# Patient Record
Sex: Female | Born: 1990 | Race: Black or African American | Hispanic: No | Marital: Single | Smoking: Never smoker
Health system: Southern US, Community
[De-identification: ages and names within clinical notes are randomized; demographics above are authoritative.]

## PROBLEM LIST (undated history)

## (undated) DIAGNOSIS — A549 Gonococcal infection, unspecified: Secondary | ICD-10-CM

## (undated) DIAGNOSIS — G43909 Migraine, unspecified, not intractable, without status migrainosus: Secondary | ICD-10-CM

## (undated) DIAGNOSIS — A749 Chlamydial infection, unspecified: Secondary | ICD-10-CM

## (undated) DIAGNOSIS — N879 Dysplasia of cervix uteri, unspecified: Secondary | ICD-10-CM

## (undated) DIAGNOSIS — Z8619 Personal history of other infectious and parasitic diseases: Secondary | ICD-10-CM

## (undated) DIAGNOSIS — I10 Essential (primary) hypertension: Secondary | ICD-10-CM

## (undated) DIAGNOSIS — A599 Trichomoniasis, unspecified: Secondary | ICD-10-CM

## (undated) HISTORY — DX: Essential (primary) hypertension: I10

## (undated) HISTORY — DX: Chlamydial infection, unspecified: A74.9

## (undated) HISTORY — DX: Gonococcal infection, unspecified: A54.9

## (undated) HISTORY — PX: TONSILLECTOMY AND ADENOIDECTOMY: SUR1326

## (undated) HISTORY — DX: Trichomoniasis, unspecified: A59.9

## (undated) HISTORY — DX: Migraine, unspecified, not intractable, without status migrainosus: G43.909

## (undated) HISTORY — DX: Dysplasia of cervix uteri, unspecified: N87.9

---
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
## 2011-01-03 LAB — DIFFERENTIAL
Basophils Relative: 0 % (ref 0–1)
Eosinophils Absolute: 0.1 10*3/uL (ref 0.0–1.2)
Monocytes Absolute: 0.4 10*3/uL (ref 0.2–1.2)
Monocytes Relative: 4 % (ref 3–11)
Neutro Abs: 9.5 10*3/uL — ABNORMAL HIGH (ref 1.7–8.0)

## 2011-01-03 LAB — URINALYSIS, ROUTINE W REFLEX MICROSCOPIC
Ketones, ur: 15 mg/dL — AB
Leukocytes, UA: NEGATIVE
Leukocytes, UA: NEGATIVE
Nitrite: NEGATIVE
Specific Gravity, Urine: >1.03 — ABNORMAL HIGH (ref 1.005–1.030)
Specific Gravity, Urine: >1.03 — ABNORMAL HIGH (ref 1.005–1.030)
Urobilinogen, UA: 0.2 mg/dL (ref 0.0–1.0)
Urobilinogen, UA: 1 mg/dL (ref 0.0–1.0)
pH: 5.5 (ref 5.0–8.0)

## 2011-01-03 LAB — URINE MICROSCOPIC-ADD ON

## 2011-01-03 LAB — CBC
Hemoglobin: 12.1 g/dL (ref 12.0–16.0)
Hemoglobin: 8.7 g/dL — ABNORMAL LOW (ref 12.0–16.0)
MCHC: 34.2 g/dL (ref 31.0–37.0)
MCHC: 35.5 g/dL (ref 31.0–37.0)
MCV: 86.8 fL (ref 78.0–98.0)
MCV: 91.2 fL (ref 78.0–98.0)
Platelets: 181 10*3/uL (ref 150–400)
Platelets: 226 10*3/uL (ref 150–400)
Platelets: 244 10*3/uL (ref 150–400)
RBC: 2.56 MIL/uL — ABNORMAL LOW (ref 3.80–5.70)
RBC: 2.83 MIL/uL — ABNORMAL LOW (ref 3.80–5.70)
RBC: 4.26 MIL/uL (ref 3.80–5.70)
RDW: 13.9 % (ref 11.4–15.5)
RDW: 14.1 % (ref 11.4–15.5)
WBC: 14.4 10*3/uL — ABNORMAL HIGH (ref 4.5–13.5)
WBC: 18.1 10*3/uL — ABNORMAL HIGH (ref 4.5–13.5)

## 2011-01-03 LAB — RAPID URINE DRUG SCREEN, HOSP PERFORMED
Amphetamines: NOT DETECTED
Barbiturates: NOT DETECTED
Benzodiazepines: NOT DETECTED

## 2011-01-03 LAB — COMPREHENSIVE METABOLIC PANEL
ALT: 13 U/L (ref 0–35)
Alkaline Phosphatase: 287 U/L — ABNORMAL HIGH (ref 47–119)
BUN: 12 mg/dL (ref 6–23)
CO2: 23 mEq/L (ref 19–32)
Calcium: 9.2 mg/dL (ref 8.4–10.5)
Creatinine, Ser: 0.8 mg/dL (ref 0.4–1.2)
Potassium: 4.1 mEq/L (ref 3.5–5.1)
Sodium: 137 mEq/L (ref 135–145)

## 2011-01-03 LAB — WET PREP, GENITAL

## 2011-01-03 LAB — HEPATITIS B SURFACE ANTIGEN: Hepatitis B Surface Ag: NEGATIVE

## 2011-01-03 LAB — MAGNESIUM: Magnesium: 8.4 mg/dL (ref 1.5–2.5)

## 2011-01-03 LAB — STREP B DNA PROBE

## 2011-01-03 LAB — TYPE AND SCREEN: Antibody Screen: NEGATIVE

## 2011-01-03 LAB — GC/CHLAMYDIA PROBE AMP, GENITAL: Chlamydia, DNA Probe: NEGATIVE

## 2011-01-03 LAB — RUBELLA SCREEN: Rubella: 55.5 IU/mL — ABNORMAL HIGH

## 2011-01-03 LAB — SICKLE CELL SCREEN: Sickle Cell Screen: NEGATIVE

## 2011-01-03 LAB — RPR: RPR Ser Ql: NONREACTIVE

## 2011-05-05 ENCOUNTER — Other Ambulatory Visit (HOSPITAL_COMMUNITY)
Admission: RE | Admit: 2011-05-05 | Discharge: 2011-05-05 | Disposition: A | Discharge status: Home / Self Care | Payer: BC Managed Care – PPO | Source: Ambulatory Visit | Attending: Gynecology | Admitting: Gynecology

## 2011-05-05 ENCOUNTER — Encounter: Payer: Self-pay | Admitting: Gynecology

## 2011-05-05 ENCOUNTER — Ambulatory Visit (INDEPENDENT_AMBULATORY_CARE_PROVIDER_SITE_OTHER): Payer: BC Managed Care – HMO | Admitting: Gynecology

## 2011-05-05 VITALS — BP 128/88

## 2011-05-05 DIAGNOSIS — N870 Mild cervical dysplasia: Secondary | ICD-10-CM

## 2011-05-05 DIAGNOSIS — Z01419 Encounter for gynecological examination (general) (routine) without abnormal findings: Secondary | ICD-10-CM | POA: Insufficient documentation

## 2011-05-05 NOTE — Patient Instructions (Signed)
Please call Amy at [phone] to order Nexplanon for later this month and to make appointment for you to remove old one and replace with new one same day.

## 2011-05-05 NOTE — Progress Notes (Signed)
Patient is a 21 year old who on 10/29/2010 on her Pap smear had the following:  Diagnosis LOW GRADE SQUAMOUS INTRAEPITHELIAL LESION: CIN-1/ HPV (LSIL).  She subsequently underwent colposcopic evaluation and biopsy with the following findings:  1. Cervix, biopsy, 12 o'clock - BENIGN ECTOCERVIX. 2. Endocervix, curettage - FREE FLOATING FRAGMENT OF LOW GRADE SQUAMOUS INTRAEPITHELIAL LESION (CIN-I WITH HPV EFFECT). SEE COMMENT. - BENIGN ENDOCERVICAL GLANDS IN INFLAMED MUCUS AND BLOOD   The following recommendations were discussed with the patient:ASCCP guidelines, developed prior to the change in screening recommendations, indicated that for adolescents (age 5 to 104 according to the ASCCP guidelines) who have had cervical cytology, expectant management of confirmed CIN 1 is preferred, irrespective of the index cytologic diagnosis or a satisfactory or unsatisfactory colposcopic examination, because undetected high grade disease is uncommon in this setting, invasive cancer is rare, and regression to normal is common  Patient presented to the office today for followup Pap smear which was done. She scheduled to return in 6 months for her annual exam. She is sexually active and has an implanon  on in her left arm due to be removed next month.

## 2011-05-13 ENCOUNTER — Ambulatory Visit: Payer: BC Managed Care – HMO | Admitting: Gynecology

## 2011-05-16 ENCOUNTER — Ambulatory Visit: Payer: BC Managed Care – HMO | Admitting: Gynecology

## 2011-05-22 ENCOUNTER — Ambulatory Visit (INDEPENDENT_AMBULATORY_CARE_PROVIDER_SITE_OTHER): Payer: BC Managed Care – PPO | Admitting: Gynecology

## 2011-05-22 ENCOUNTER — Encounter: Payer: Self-pay | Admitting: Gynecology

## 2011-05-22 DIAGNOSIS — Z113 Encounter for screening for infections with a predominantly sexual mode of transmission: Secondary | ICD-10-CM

## 2011-05-22 DIAGNOSIS — IMO0002 Reserved for concepts with insufficient information to code with codable children: Secondary | ICD-10-CM

## 2011-05-22 DIAGNOSIS — B9689 Other specified bacterial agents as the cause of diseases classified elsewhere: Secondary | ICD-10-CM

## 2011-05-22 DIAGNOSIS — R87612 Low grade squamous intraepithelial lesion on cytologic smear of cervix (LGSIL): Secondary | ICD-10-CM

## 2011-05-22 DIAGNOSIS — A499 Bacterial infection, unspecified: Secondary | ICD-10-CM

## 2011-05-22 DIAGNOSIS — N760 Acute vaginitis: Secondary | ICD-10-CM

## 2011-05-22 DIAGNOSIS — N898 Other specified noninflammatory disorders of vagina: Secondary | ICD-10-CM

## 2011-05-22 LAB — WET PREP FOR TRICH, YEAST, CLUE
Trich, Wet Prep: NONE SEEN
Yeast Wet Prep HPF POC: NONE SEEN

## 2011-05-22 MED ORDER — CLINDAMYCIN PHOSPHATE 2 % VA CREA: 1.0000 | TOPICAL_CREAM | Freq: Every day | VAGINAL | Status: DC

## 2011-05-22 MED ORDER — CLINDAMYCIN PHOSPHATE 2 % VA CREA: 1.0000 | TOPICAL_CREAM | Freq: Every day | VAGINAL | Status: AC

## 2011-05-22 NOTE — Patient Instructions (Signed)
Bacterial Vaginosis Bacterial vaginosis (BV) is a vaginal infection where the normal balance of bacteria in the vagina is disrupted. The normal balance is then replaced by an overgrowth of certain bacteria. There are several different kinds of bacteria that can cause BV. BV is the most common vaginal infection in women of childbearing age. CAUSES   The cause of BV is not fully understood. BV develops when there is an increase or imbalance of harmful bacteria.   Some activities or behaviors can upset the normal balance of bacteria in the vagina and put women at increased risk including:   Having a new sex partner or multiple sex partners.   Douching.   Using an intrauterine device (IUD) for contraception.   It is not clear what role sexual activity plays in the development of BV. However, women that have never had sexual intercourse are rarely infected with BV.  Women do not get BV from toilet seats, bedding, swimming pools or from touching objects around them.  SYMPTOMS   Grey vaginal discharge.   A fish-like odor with discharge, especially after sexual intercourse.   Itching or burning of the vagina and vulva.   Burning or pain with urination.   Some women have no signs or symptoms at all.  DIAGNOSIS  Your caregiver must examine the vagina for signs of BV. Your caregiver will perform lab tests and look at the sample of vaginal fluid through a microscope. They will look for bacteria and abnormal cells (clue cells), a pH test higher than 4.5, and a positive amine test all associated with BV.  RISKS AND COMPLICATIONS   Pelvic inflammatory disease (PID).   Infections following gynecology surgery.   Developing HIV.   Developing herpes virus.  TREATMENT  Sometimes BV will clear up without treatment. However, all women with symptoms of BV should be treated to avoid complications, especially if gynecology surgery is planned. Female partners generally do not need to be treated. However,  BV may spread between female sex partners so treatment is helpful in preventing a recurrence of BV.   BV may be treated with antibiotics. The antibiotics come in either pill or vaginal cream forms. Either can be used with nonpregnant or pregnant women, but the recommended dosages differ. These antibiotics are not harmful to the baby.   BV can recur after treatment. If this happens, a second round of antibiotics will often be prescribed.   Treatment is important for pregnant women. If not treated, BV can cause a premature delivery, especially for a pregnant woman who had a premature birth in the past. All pregnant women who have symptoms of BV should be checked and treated.   For chronic reoccurrence of BV, treatment with a type of prescribed gel vaginally twice a week is helpful.  HOME CARE INSTRUCTIONS   Finish all medication as directed by your caregiver.   Do not have sex until treatment is completed.   Tell your sexual partner that you have a vaginal infection. They should see their caregiver and be treated if they have problems, such as a mild rash or itching.   Practice safe sex. Use condoms. Only have 1 sex partner.  PREVENTION  Basic prevention steps can help reduce the risk of upsetting the natural balance of bacteria in the vagina and developing BV:  Do not have sexual intercourse (be abstinent).   Do not douche.   Use all of the medicine prescribed for treatment of BV, even if the signs and symptoms go away.     Tell your sex partner if you have BV. That way, they can be treated, if needed, to prevent reoccurrence.  SEEK MEDICAL CARE IF:   Your symptoms are not improving after 3 days of treatment.   You have increased discharge, pain, or fever.  MAKE SURE YOU:   Understand these instructions.   Will watch your condition.   Will get help right away if you are not doing well or get worse.  FOR MORE INFORMATION  Division of STD Prevention (DSTDP), Centers for Disease  Control and Prevention: www.cdc.gov/std American Social Health Association (ASHA): www.ashastd.org  Document Released: 03/17/2005 Document Revised: 11/27/2010 Document Reviewed: 09/07/2008 [employer] Patient Information 2012 [employer]. 

## 2011-05-22 NOTE — Progress Notes (Signed)
The patient and 21 year old who presented to the office today as a result of her abnormal Pap smear. The following is her Pap smear history:  July 2012 low-grade squamous intraepithelial lesion August 2012 colposcopic directed biopsy demonstrating CIN-02 May 2011 followup Pap smear low-grade squamous intraepithelial lesion few cells suggestive of higher grade lesion  Patient presented for followup colposcopic evaluation due to concerning findings on Pap smear the following was noted:  Physical Exam  Genitourinary:     Patient underwent a detail colposcopic evaluation to include the external genitalia perineum and perirectal region with no abnormalities noted. The entire vagina was inspected thoroughly after applying acetic acid and no lesions were seen. Only the ectocervical lesions seen at the 12 and 3:00 position which were respectively biopsied. The transformation zone was visualized completely and patient underwent an ECC as well. Patient was having vaginal discharge so GC and Chlamydia culture along with a wet prep was done. The wet prep demonstrated bacterial vaginosis and she will be started on Cleocin vaginal cream to apply each bedtime for 5 days.

## 2011-05-23 LAB — GC/CHLAMYDIA PROBE AMP, GENITAL
Chlamydia, DNA Probe: NEGATIVE
GC Probe Amp, Genital: NEGATIVE

## 2011-05-29 ENCOUNTER — Ambulatory Visit (INDEPENDENT_AMBULATORY_CARE_PROVIDER_SITE_OTHER): Payer: BC Managed Care – PPO | Admitting: Gynecology

## 2011-05-29 ENCOUNTER — Encounter: Payer: Self-pay | Admitting: Gynecology

## 2011-05-29 DIAGNOSIS — Z8619 Personal history of other infectious and parasitic diseases: Secondary | ICD-10-CM

## 2011-05-29 DIAGNOSIS — R87613 High grade squamous intraepithelial lesion on cytologic smear of cervix (HGSIL): Secondary | ICD-10-CM

## 2011-05-29 DIAGNOSIS — IMO0002 Reserved for concepts with insufficient information to code with codable children: Secondary | ICD-10-CM

## 2011-05-29 NOTE — Patient Instructions (Signed)
HIGH GRADE LESIONS: CIN 2,3 -- CIN 2 and 3 are high grade lesions. They are managed in the same way because histological distinction between the two grades of CIN is poorly reproducible [1,39-41]. Given the overall high risk of progression of both CIN 2 and 3, prompt treatment is recommended, with the exception of pregnant women and adolescents. Since most patients with CIN 2 or CIN 3 have some intervention rather than expectant management, data on outcome of untreated high-grade disease are lacking. For CIN 2 lesions, it appears that 40 to 58 percent of lesions will regress if left untreated, while 22 percent progress to CIN 3 and 5 percent progress to invasive cancer [41-44]. In data from the ALTS trial, regression of CIN 2 was less likely in women with HPV-16 subtype than other subtypes [44]. For CIN 3, the estimated spontaneous regression rate is 32 to 47 percent, with 12 to 40 percent progressing to invasive cancer if untreated (figure 1) [43,45-49]. The best data on the natural history of histologically confirmed CIN 3 is derived from a study that evaluated the incidence of invasive cancer over time in two groups of women with this lesion: 143 women who received close follow-up but no treatment and 593 women who received adequate or probably adequate treatment [49]. The cumulative incidence of invasive cancer of the cervix/vaginal vault was significantly higher in untreated women at 10 years (20 versus 0.3 percent) and 30 years (31 versus 0.7 percent). Ninety-two of the 143 women who received close follow-up, but no treatment, had cytologic evidence of persistent disease 6 to 24 months after initial diagnosis of CIN 3. In this subgroup, the cumulative incidence of invasive cancer of the cervix/vaginal vault at 10 and 30 years was 31 and 50 percent, respectively. One explanation for the lower rate of progression of CIN 2 than CIN 3 is that CIN 2 is more likely to be caused by oncogenic HPV subtypes  31,33,35,39,45,51,52, and 58, which have a weaker association with development of cancer than the more highly oncogenic subtypes HPV 16 and 18, which are commonly found with CIN 3 [50]. Treatment options -- Treatment options fall into one of two main categories: procedures that ablate the abnormal tissue; these do not produce a specimen for additional histologic evaluation, and procedures that excise the area of abnormality; these allow further histologic study. Clinical trials comparing the different treatment modalities have not found that any treatment is significantly more successful than another [2,51-53]. Regardless of the modality used, the entire transformation zone should be eliminated [54]. Prior to any therapeutic intervention, an assessment needs to be made as to whether a patient qualifies for ablative therapy or if she requires a more invasive excisional procedure for further diagnostic work-up. A full discussion of ablative therapy can be found separately. (See "Cervical intraepithelial neoplasia: Ablative therapies".) Ablative therapy -- Women are candidates for ablative therapy if they have no suspected glandular or invasive squamous disease and are compliant with follow-up. A full discussion of can be found separately. (See "Cervical intraepithelial neoplasia: Ablative therapies".) Excisional therapy -- Indications for excisional therapy are: Suspected microinvasion  Unsatisfactory colposcopy (the transformation zone is not fully visualized)  Lesion extending into the endocervical canal (including CIN 1)  Endocervical curettage showing CIN or a glandular abnormality  Lack of correlation between the cytology and colposcopy/biopsies  Suspected adenocarcinoma in situ  Colposcopist unable to rule out invasive disease  Recurrence after an ablative or previous excisional procedure Excisional treatment can be performed by cold knife  conization, laser conization, or the loop electrosurgical  excision procedure (LEEP), also called large loop excision of the transformation zone (LLETZ). In general, with suspected microinvasion or adenocarcinoma in situ (AIS), cold knife conization is often recommended so that margins can be evaluated without cautery artifact. Operator experience may be a consideration in choosing between LEEP and cold knife conization. The depth of conization should be limited to the minimum necessary in reproductive age women. (See "Cervical intraepithelial neoplasia: Reproductive effects of treatment", section on 'Depth of excision'.) Cytologically and colposcopically unsuspected AIS and microinvasive squamous carcinomas have been reported in approximately 0.5 percent of cervical specimens obtained through excision [55]. In one report of 1189 patients who underwent LEEP, 15 patients (1.3 percent) had adenocarcinoma in situ and six (0.5 percent) had microinvasive squamous cell carcinoma [56]. Ten (67 percent) of the adenocarcinomas in situ and two (33 percent) of the microinvasive carcinomas were not recognized by cytology and colposcopy. Another series of 3738 women who underwent laser ablation for CIN reported nine invasive or microinvasive cancers of the cervix (0.24 percent) during follow-up [57]. These observations, in combination with the development of LEEP as an effective and well tolerated outpatient procedure, have been a catalyst for the increasing use of excisional methods in the treatment of CIN, especially for high grade lesions. A diagnostic, excisional procedure and sampling of the endocervical canal in women in whom the complete transformation zone is not visualized is important to exclude cancer. This was illustrated in studies showing that as many as 7 percent of women with CIN 2,3 and an unsatisfactory colposcopic examination had occult invasive cervical cancer [52,58]. Hysterectomy -- Hysterectomy should not be performed as an initial treatment of CIN 2,3. The  incidence of significant morbidity with hysterectomy is higher than with the less invasive modalities discussed above. There are, however, some indications for which hysterectomy remains a valid treatment option for CIN [24,25]: Conization specimen margins that are positive for CIN 2,3, especially in the setting of completed childbearing and expected poor compliance with follow-up. In most cases, however, the treatment of choice is close clinical follow-up or, in select cases, reexcision to exclude an invasive cancer instead of hysterectomy. Hysterectomy should generally be reserved for those in whom a repeat excisional procedure is not technically feasible or where the cervix and vagina are scarred in a way that severely compromises the reliability of cervical cytology and close clinical follow-up.  If hysterectomy is contemplated in the setting of cone margins and endocervical sampling that are positive for CIN 3 carcinoma in situ, consideration should be given to performing a frozen cone at the time of hysterectomy in order to avoid performing an inappropriate simple hysterectomy in a patient with occult invasive carcinoma [52,53].  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 ASCCP guidelines, developed prior to the change in screening recommendations, indicated that in adolescents (age 18 to 78 according to the ASCCP guidelines) with CIN 2,3, the rate of regression is high and progression to invasive cancer is extremely small [1,31-33]. Therefore, observation with colposcopy and cytology at six-month intervals for up to two years is preferable to excisional or ablative therapy for reliable adolescents with biopsy-confirmed CIN 2,3 as long as colposcopy is satisfactory, endocervical  curettage is negative, and the possibility of occult disease is acknowledged by the patient [1]. Repeat biopsy is indicated if high  grade disease persists at 12 months and treatment is indicated if it persists for 24 months. (See "Consent in adolescent health care" and "Confidentiality in adolescent health care".) If there is a specific histological diagnosis of CIN 2 (rather than CIN 2,3), observation is preferable to treatment, while ablation or excision has traditionally been recommended for adolescents with CIN 3 or with unsatisfactory colposcopy [1]. Whether the latter recommendation should be modified is a matter of debate, given current screening guidelines and the above discussed natural history and risk/benefit data. After two consecutive negative results, the patient can return to a routine screening interval. Pregnancy -- Treatment of CIN 2,3 is not indicated during pregnancy because high grade lesions discovered during pregnancy have a high rate of regression in the postpartum period. In one study, 70 percent of 153 women with CIN 3 had regression and none progressed to invasive carcinoma [36]. This underscores the role for conservative antepartum management followed by careful postpartum evaluation. Furthermore, the morbidity associated with cervical conization during pregnancy is substantial. CIN 2,3 should be monitored during pregnancy with colposcopy (without endocervical curettage); there are no data on which to base a recommendation for the optimal interval, but once each trimester is a reasonable approach [1]. Colposcopy and cervical cytology should be performed 6 to 12 weeks postpartum [45]. Repeat biopsies are indicated only if worsening disease is suspected by cytology or colposcopic appearance, with a diagnostic excisional procedure if invasive disease is suspected and knowledge of the diagnosis will alter management [59]. Issues regarding diagnosis and management of cervical cancer in  pregnant women are discussed in detail separately. (See "Cervical cancer in pregnancy".)

## 2011-05-29 NOTE — Progress Notes (Signed)
Patient is a 21 year old who was seen in the office on February 21 for colposcopic directed cervical biopsy as a result of Pap smear February 2013 demonstrating low-grade squamous intraepithelial lesion with a few cells suggestive of a higher grade lesion. The following is a summary of her previous Pap smears:  July 2012 low-grade squamous intraepithelial lesion  August 2012 colposcopic directed biopsy demonstrating CIN-02 May 2011 followup Pap smear low-grade squamous intraepithelial lesion few cells suggestive of higher grade lesion  Biopsy report demonstrated the following:  FINAL DIAGNOSIS Diagnosis 1. Endocervix, curettage - BENIGN ENDOCERVICAL GLANDS AND ABUNDANT MUCIN. 2. Cervix, biopsy, 10 o'clock - HIGH GRADE SQUAMOUS INTRAEPITHELIAL LESION, CIN-III (SEVERE DYSPLASIA/CIS). 3. Cervix, biopsy, 4 o'clock - HIGH GRADE SQUAMOUS INTRAEPITHELIAL LESION, CIN-II (MODERATE DYSPLASIA). 4. Cervix, biopsy, 10 o'clock - HIGH GRADE SQUAMOUS INTRAEPITHELIAL LESION, CIN II-III (MODERATE/SEVERE DYSPLASIA/CIS). Abigail Miyamoto MD  Patient presented to the office today for discussion of her severe dysplasia and we had her mother on the phone at the same time to relate the following message:  Adolescents -- Current guidelines from professional societies advise against commencing cervical cancer screening before age 10. ASCCP guidelines, developed prior to the change in screening recommendations, indicated that in adolescents (age 64 to 41 according to the ASCCP guidelines) with CIN 2,3, the rate of regression is high and progression to invasive cancer is extremely small [1,31-33]. Therefore, observation with colposcopy and cytology at six-month intervals for up to two years is preferable to excisional or ablative therapy for reliable adolescents with biopsy-confirmed CIN 2,3 as long as colposcopy is satisfactory, endocervical curettage is negative, and the possibility of occult disease is  acknowledged by the patient [1]. Repeat biopsy is indicated if high grade disease persists at 12 months and treatment is indicated if it persists for 24 months.  I had also discussed the case with Dr. Hazle Coca GYN oncologist at [facility] who recommended the above as well.  The mother and daughter are fully aware of the importance of good compliance and followup. All questions are answered and we'll follow accordingly.

## 2011-06-05 ENCOUNTER — Ambulatory Visit (INDEPENDENT_AMBULATORY_CARE_PROVIDER_SITE_OTHER): Payer: BC Managed Care – PPO | Admitting: Gynecology

## 2011-06-05 ENCOUNTER — Encounter: Payer: Self-pay | Admitting: Gynecology

## 2011-06-05 VITALS — BP 130/90

## 2011-06-05 DIAGNOSIS — IMO0001 Reserved for inherently not codable concepts without codable children: Secondary | ICD-10-CM

## 2011-06-05 DIAGNOSIS — Z3046 Encounter for surveillance of implantable subdermal contraceptive: Secondary | ICD-10-CM

## 2011-06-05 DIAGNOSIS — Z30017 Encounter for initial prescription of implantable subdermal contraceptive: Secondary | ICD-10-CM

## 2011-06-05 NOTE — Progress Notes (Signed)
Patient presented to the office today to remove her implant on her left arm that was placed in March of 2010. She wanted to replaced with a new one. We'll replaced with a nexplanon.  Patient's arm was placed flexed exposing the area of her medial aspect of her upper arm. Previous scar from placement of previous Implanon was noted. The capsule was palpated. The area was cleansed with Betadine solution. A small incision was made and with countertraction the capsule type appeared to the incision site and it was grasped with a hemostat and retrieved shown to the patient and discarded. The following steps were undertaken to place a new nexplanon:                                              Nexplanon Procedure Note     The patient was laying on her back with her nondominant arm flexed at the elbow and externally rotated. The insertion site was identified as the underside of the nondominant upper arm approximately 8 cm from the medial epicondyle of the humerus. 2 marks were made with a sterile marker: The first marked the spot where the Nexplanon  implant was to be inserted, and a second, marked a spot a few centimeters proximal to the first marke to guide the direction of the insertion. The area was cleansed with Betadine solution. The area was anesthetized with 1% lidocaine  (1 cc)  at the area the injection site and underneath the skin along the planned insertion tunnel. The preloaded disposable Nexplanon was removed from its sterile casing.  The applicator was held above the needle at the textured surface area. The transparent protector was removed. With a freehand, the skin was stretched around the insertion site with a thumb and index finger.  with the tip of the needle angled at 30. The Nexplanon applicator was lowered to a horizontal position. While lifting the skin with the tip of the needle the needle was then slid to its full length. The applicator was kept in sitting position with a needle inserted  to its full length. The purple slider was unlocked by pushing it slightly downward. The slider was fully moved back until it stopped. This allowed the implant to be in the final subdermal position and the needle was locked inside the body of the applicator. The applicator was then removed. A Steri-Strip was made over the incision and a Kerlix wrap was placed which patient is to remove tomorrow. No complications patient tolerated procedure well and was released home with instructions. Of note the skin was reapproximated with interrupted sutures of 4-0 Vicryl suture and she'll return back next week to remove the sutures.   [facility] [id number]:11 PMTD@

## 2011-06-10 ENCOUNTER — Encounter: Payer: Self-pay | Admitting: Gynecology

## 2011-06-10 ENCOUNTER — Ambulatory Visit (INDEPENDENT_AMBULATORY_CARE_PROVIDER_SITE_OTHER): Payer: BC Managed Care – PPO | Admitting: Gynecology

## 2011-06-10 VITALS — BP 138/88

## 2011-06-10 DIAGNOSIS — Z9889 Other specified postprocedural states: Secondary | ICD-10-CM

## 2011-06-10 NOTE — Progress Notes (Signed)
Patient presented to the office today to remove the suture from her upper left forearm after her Implanon had been removed and a new Explanon had been put in place. Incision site was intact sutures removed and the incision Steri-Stripped.  Patient was once again reminded of the importance of followup as a result of her recent high-grade dysplasia noted on colposcopic directed biopsy. The guidelines were re- discussed as follows:  Current guidelines from professional societies advise against commencing cervical cancer screening before age 36. ASCCP guidelines, developed prior to the change in screening recommendations, indicated that in adolescents (age 51 to 108 according to the ASCCP guidelines) with CIN 2,3, the rate of regression is high and progression to invasive cancer is extremely small [1,31-33]. Therefore, observation with colposcopy and cytology at six-month intervals for up to two years is preferable to excisional or ablative therapy for reliable adolescents with biopsy-confirmed CIN 2,3 as long as colposcopy is satisfactory, endocervical curettage is negative, and the possibility of occult disease is acknowledged by the patient [1]. Repeat biopsy is indicated if high grade disease persists at 12 months and treatment is indicated if it persists for 24 months.  I had also discussed the case with Dr. Hazle Coca GYN oncologist at [facility] who recommended the above as well. Patient fully aware that she is to return back in 6 months for colposcopy.

## 2011-08-20 ENCOUNTER — Other Ambulatory Visit: Payer: Self-pay | Admitting: Gynecology

## 2011-09-17 ENCOUNTER — Ambulatory Visit (INDEPENDENT_AMBULATORY_CARE_PROVIDER_SITE_OTHER): Payer: BC Managed Care – PPO | Admitting: Women's Health

## 2011-09-17 ENCOUNTER — Encounter: Payer: Self-pay | Admitting: Women's Health

## 2011-09-17 DIAGNOSIS — A599 Trichomoniasis, unspecified: Secondary | ICD-10-CM

## 2011-09-17 DIAGNOSIS — N898 Other specified noninflammatory disorders of vagina: Secondary | ICD-10-CM

## 2011-09-17 LAB — WET PREP FOR TRICH, YEAST, CLUE

## 2011-09-17 MED ORDER — METRONIDAZOLE 0.75 % VA GEL: VAGINAL | Status: DC

## 2011-09-17 MED ORDER — METRONIDAZOLE 500 MG PO TABS: ORAL_TABLET | ORAL | Status: DC

## 2011-09-17 NOTE — Patient Instructions (Signed)
Trichomoniasis  Trichomoniasis is an infection, caused by the Trichomonas organism, that affects both women and men. In women, the outer female genitalia and the vagina are affected. In men, the penis is mainly affected, but the prostate and other reproductive organs can also be involved. Trichomoniasis is a sexually transmitted disease (STD) and is most often passed to another person through sexual contact. The majority of people who get trichomoniasis do so from a sexual encounter and are also at risk for other STDs.  CAUSES    Sexual intercourse with an infected partner.   It can be present in swimming pools or hot tubs.  SYMPTOMS    Abnormal gray-green frothy vaginal discharge in women.   Vaginal itching and irritation in women.   Itching and irritation of the area outside the vagina in women.   Penile discharge with or without pain in males.   Inflammation of the urethra (urethritis), causing painful urination.   Bleeding after sexual intercourse.  RELATED COMPLICATIONS   Pelvic inflammatory disease.   Infection of the uterus (endometritis).   Infertility.   Tubal (ectopic) pregnancy.   It can be associated with other STDs, including gonorrhea and chlamydia, hepatitis B, and HIV.  COMPLICATIONS DURING PREGNANCY   Early (premature) delivery.   Premature rupture of the membranes (PROM).   Low birth weight.  DIAGNOSIS    Visualization of Trichomonas under the microscope from the vagina discharge.   Ph of the vagina greater than 4.5, tested with a test tape.   Trich Rapid Test.   Culture of the organism, but this is not usually needed.   It may be found on a Pap test.   Having a "strawberry cervix,"which means the cervix looks very red like a strawberry.  TREATMENT    You may be given medication to fight the infection. Inform your caregiver if you could be or are pregnant. Some medications used to treat the infection should not be taken during pregnancy.   Over-the-counter medications or  creams to decrease itching or irritation may be recommended.   Your sexual partner will need to be treated if infected.  HOME CARE INSTRUCTIONS    Take all medication prescribed by your caregiver.   Take over-the-counter medication for itching or irritation as directed by your caregiver.   Do not have sexual intercourse while you have the infection.   Do not douche or wear tampons.   Discuss your infection with your partner, as your partner may have acquired the infection from you. Or, your partner may have been the person who transmitted the infection to you.   Have your sex partner examined and treated if necessary.   Practice safe, informed, and protected sex.   See your caregiver for other STD testing.  SEEK MEDICAL CARE IF:    You still have symptoms after you finish the medication.   You have an oral temperature above 102 F (38.9 C).   You develop belly (abdominal) pain.   You have pain when you urinate.   You have bleeding after sexual intercourse.   You develop a rash.   The medication makes you sick or makes you throw up (vomit).  Document Released: 09/10/2000 Document Revised: 03/06/2011 Document Reviewed: 10/06/2008  [employer] Patient Information 2012 [employer].

## 2011-09-17 NOTE — Progress Notes (Signed)
Patient ID: Sara Benson, female   DOB: 14-May-1990, 21 y.o.   MRN: [id number] Presents with 2 complaints. Increased vaginal discharge with odor and irritation for approximately one month. Same partner for years, amenorrheic with nexplanon. Also states has a skin bump midsternum for several weeks.  Exam: 1 cm superficial skin nodule, with slight pressure exuded small amount of white drainage. External genitalia slightly erythematous at introitus, speculum exam moderate amount of adherent white malodorous discharge, wet prep positive for clues, amines and trichomonas. GC/Chlamydia culture taken and is pending. Bimanual no CMT or adnexal fullness or tenderness.  Trichomonas  Plan: Flagyl 2 g by mouth x1 dose for both patient and partner. Instructed to inform partner for treatment, abstain, discuss fidelity with partner. Declines HIV, hepatitis or RPR today states will have an annual exam in August.

## 2011-09-19 ENCOUNTER — Other Ambulatory Visit: Payer: Self-pay | Admitting: Women's Health

## 2011-09-19 DIAGNOSIS — B999 Unspecified infectious disease: Secondary | ICD-10-CM

## 2011-09-19 LAB — GC/CHLAMYDIA PROBE AMP, GENITAL: GC Probe Amp, Genital: POSITIVE — AB

## 2011-09-19 MED ORDER — AZITHROMYCIN 500 MG PO TABS: 1000.0000 mg | ORAL_TABLET | Freq: Every day | ORAL | Status: AC

## 2011-09-19 MED ORDER — CEFIXIME 400 MG PO TABS: 400.0000 mg | ORAL_TABLET | Freq: Every day | ORAL | Status: AC

## 2011-10-06 ENCOUNTER — Ambulatory Visit: Payer: BC Managed Care – PPO | Admitting: Women's Health

## 2011-10-16 ENCOUNTER — Encounter: Payer: Self-pay | Admitting: *Deleted

## 2011-10-16 NOTE — Progress Notes (Addendum)
Patient ID: Sara Benson, female   DOB: 09/20/1990, 21 y.o.   MRN: [id number] Reported to HD + chlamydia and + GC

## 2011-10-30 DIAGNOSIS — Z8619 Personal history of other infectious and parasitic diseases: Secondary | ICD-10-CM

## 2011-10-30 HISTORY — DX: Personal history of other infectious and parasitic diseases: Z86.19

## 2011-11-03 ENCOUNTER — Ambulatory Visit (INDEPENDENT_AMBULATORY_CARE_PROVIDER_SITE_OTHER): Payer: BC Managed Care – PPO | Admitting: Gynecology

## 2011-11-03 ENCOUNTER — Encounter: Payer: Self-pay | Admitting: Gynecology

## 2011-11-03 VITALS — BP 138/80 | Ht 66.5 in | Wt 236.0 lb

## 2011-11-03 DIAGNOSIS — Z01419 Encounter for gynecological examination (general) (routine) without abnormal findings: Secondary | ICD-10-CM

## 2011-11-03 DIAGNOSIS — A749 Chlamydial infection, unspecified: Secondary | ICD-10-CM

## 2011-11-03 DIAGNOSIS — N870 Mild cervical dysplasia: Secondary | ICD-10-CM

## 2011-11-03 DIAGNOSIS — Z113 Encounter for screening for infections with a predominantly sexual mode of transmission: Secondary | ICD-10-CM

## 2011-11-03 DIAGNOSIS — A549 Gonococcal infection, unspecified: Secondary | ICD-10-CM

## 2011-11-03 DIAGNOSIS — A5400 Gonococcal infection of lower genitourinary tract, unspecified: Secondary | ICD-10-CM

## 2011-11-03 DIAGNOSIS — N898 Other specified noninflammatory disorders of vagina: Secondary | ICD-10-CM

## 2011-11-03 LAB — CBC
HCT: 38.9 % (ref 36.0–46.0)
Hemoglobin: 13.6 g/dL (ref 12.0–15.0)
MCV: 82.2 fL (ref 78.0–100.0)
RDW: 13.4 % (ref 11.5–15.5)
WBC: 6.8 10*3/uL (ref 4.0–10.5)

## 2011-11-03 LAB — WET PREP FOR TRICH, YEAST, CLUE
Clue Cells Wet Prep HPF POC: NONE SEEN
Trich, Wet Prep: NONE SEEN

## 2011-11-03 MED ORDER — AZITHROMYCIN 500 MG PO TABS: ORAL_TABLET | ORAL | Status: DC

## 2011-11-03 MED ORDER — CEFIXIME 400 MG PO TABS: 400.0000 mg | ORAL_TABLET | Freq: Every day | ORAL | Status: AC

## 2011-11-03 NOTE — Patient Instructions (Signed)
Health Maintenance, 20- to 21-Year-Old SCHOOL PERFORMANCE After high school completion, the young adult may be attending college, technical or vocational school, or entering the military or the work force. SOCIAL AND EMOTIONAL DEVELOPMENT The young adult establishes adult relationships and explores sexual identity. Young adults may be living at home or in a college dorm or apartment. Increasing independence is important with young adults. Throughout adolescence, teens should assume responsibility of their own health care. IMMUNIZATIONS Most young adults should be fully vaccinated. A booster dose of Tdap (tetanus, diphtheria, and pertussis, or "whooping cough"), a dose of meningococcal vaccine to protect against a certain type of bacterial meningitis, hepatitis A, human papillomarvirus (HPV), chickenpox, or measles vaccines may be indicated, if not given at an earlier age. Annual influenza or "flu" vaccination should be considered during flu season.  TESTING Annual screening for vision and hearing problems is recommended. Vision should be screened objectively at least once between 18 and 21 years of age. The young adult may be screened for anemia or tuberculosis. Young adults should have a blood test to check for high cholesterol during this time period. Young adults should be screened for use of alcohol and drugs. If the young adult is sexually active, screening for sexually transmitted infections, pregnancy, or HIV may be performed. Screening for cervical cancer should be performed within 3 years of beginning sexual activity. NUTRITION AND ORAL HEALTH  Adequate calcium intake is important. Consume 3 servings of low-fat milk and dairy products daily. For those who do not drink milk or consume dairy products, calcium enriched foods, such as juice, bread, or cereal, dark, leafy greens, or canned fish are alternate sources of calcium.   Drink plenty of water. Limit fruit juice to 8 to 12 ounces per day.  Avoid sugary beverages or sodas.   Discourage skipping meals, especially breakfast. Teens should eat a good variety of vegetables and fruits, as well as lean meats.   Avoid high fat, high salt, and high sugar foods, such as candy, chips, and cookies.   Encourage young adults to participate in meal planning and preparation.   Eat meals together as a family whenever possible. Encourage conversation at mealtime.   Limit fast food choices and eating out at restaurants.   Brush teeth twice a day and floss.   Schedule dental exams twice a year.  SLEEP Regular sleep habits are important. PHYSICAL, SOCIAL, AND EMOTIONAL DEVELOPMENT  One hour of regular physical activity daily is recommended. Continue to participate in sports.   Encourage young adults to develop their own interests and consider community service or volunteerism.   Provide guidance to the young adult in making decisions about college and work plans.   Make sure that young adults know that they should never be in a situation that makes them uncomfortable, and they should tell partners if they do not want to engage in sexual activity.   Talk to the young adult about body image. Eating disorders may be noted at this time. Young adults may also be concerned about being overweight. Monitor the young adult for weight gain or loss.   Mood disturbances, depression, anxiety, alcoholism, or attention problems may be noted in young adults. Talk to the caregiver if there are concerns about mental illness.   Negotiate limit setting and independent decision making.   Encourage the young adult to handle conflict without physical violence.   Avoid loud noises which may impair hearing.   Limit television and computer time to 2 hours per day.   Individuals who engage in excessive sedentary activity are more likely to become overweight.  RISK BEHAVIORS  Sexually active young adults need to take precautions against pregnancy and sexually  transmitted infections. Talk to young adults about contraception.   Provide a tobacco-free and drug-free environment for the young adult. Talk to the young adult about drug, tobacco, and alcohol use among friends or at friends' homes. Make sure the young adult knows that smoking tobacco or marijuana and taking drugs have health consequences and may impact brain development.   Teach the young adult about appropriate use of over-the-counter or prescription medicines.   Establish guidelines for driving and for riding with friends.   Talk to young adults about the risks of drinking and driving or boating. Encourage the young adult to call you if he or she or friends have been drinking or using drugs.   Remind young adults to wear seat belts at all times in cars and life vests in boats.   Young adults should always wear a properly fitted helmet when they are riding a bicycle.   Use caution with all-terrain vehicles (ATVs) or other motorized vehicles.   Do not keep handguns in the home. (If you do, the gun and ammunition should be locked separately and out of the young adult's access.)   Equip your home with smoke detectors and change the batteries regularly. Make sure all family members know the fire escape plans for your home.   Teach young adults not to swim alone and not to dive in shallow water.   All individuals should wear sunscreen that protects against UVA and UVB light with at least a sun protection factor (SPF) of 30 when out in the sun. This minimizes sun burning.  WHAT'S NEXT? Young adults should visit their pediatrician or family physician yearly. By young adulthood, health care should be transitioned to a family physician or internal medicine specialist. Sexually active females may want to begin annual physical exams with a gynecologist. Document Released: 06/12/2006 Document Revised: 03/06/2011 Document Reviewed: 07/02/2006 [employer] Patient Information 2012 [employer]. 

## 2011-11-03 NOTE — Progress Notes (Signed)
Sara Benson 08/06/1990 [id number]   History:    21 y.o.  for annual gyn exam who in June of 2013 was found to have a positive GC and Chlamydia culture and patient had informed me today that she has not picked up the medication but will so today. She will be placed on Zithromax 1 g by mouth and Suprax 400 mg one by mouth as well. She denies any pelvic pain slight discharge. She has a nexplanon for contraception. Review of her record also indicated that she had a Pap smear in February this year with the following result:   Diagnosis LOW GRADE SQUAMOUS INTRAEPITHELIAL LESION: CIN-1/ HPV (LSIL); HOWEVER, THERE ARE A FEW CELLS SUGGESTIVE OF A HIGHER GRADE LESION  Patient scheduled to return next month for colposcopy and biopsies and she is 21 years of age.   Past medical history,surgical history, family history and social history were all reviewed and documented in the EPIC chart.  Gynecologic History Patient's last menstrual period was 10/05/2011. Contraception: Nexplanon Last Pap: See above. Results were: Low-grade SIL possible higher lesion Last mammogram: Not indicated. Resu not indicated abn:[id number]}  Obstetric History OB History    Grav Para Term Preterm Abortions TAB SAB Ect Mult Living   1 1        1      # Outc Date GA Lbr Len/2nd Wgt Sex Del Anes PTL Lv   1 PAR                ROS: A ROS was performed and pertinent positives and negatives are included in the history.  GENERAL: No fevers or chills. HEENT: No change in vision, no earache, sore throat or sinus congestion. NECK: No pain or stiffness. CARDIOVASCULAR: No chest pain or pressure. No palpitations. PULMONARY: No shortness of breath, cough or wheeze. GASTROINTESTINAL: No abdominal pain, nausea, vomiting or diarrhea, melena or bright red blood per rectum. GENITOURINARY: No urinary frequency, urgency, hesitancy or dysuria. MUSCULOSKELETAL: No joint or muscle pain, no back pain, no recent trauma. DERMATOLOGIC: No rash, no  itching, no lesions. ENDOCRINE: No polyuria, polydipsia, no heat or cold intolerance. No recent change in weight. HEMATOLOGICAL: No anemia or easy bruising or bleeding. NEUROLOGIC: No headache, seizures, numbness, tingling or weakness. PSYCHIATRIC: No depression, no loss of interest in normal activity or change in sleep pattern.     Exam: chaperone present  BP 138/80  Ht 5' 6.5" (1.689 m)  Wt 236 lb (107.049 kg)  BMI 37.52 kg/m2  LMP 10/05/2011  Body mass index is 37.52 kg/(m^2).  General appearance : Well developed well nourished female. No acute distress HEENT: Neck supple, trachea midline, no carotid bruits, no thyroidmegaly Lungs: Clear to auscultation, no rhonchi or wheezes, or rib retractions  Heart: Regular rate and rhythm, no murmurs or gallops Breast:Examined in sitting and supine position were symmetrical in appearance, no palpable masses or tenderness,  no skin retraction, no nipple inversion, no nipple discharge, no skin discoloration, no axillary or supraclavicular lymphadenopathy Abdomen: no palpable masses or tenderness, no rebound or guarding Extremities: no edema or skin discoloration or tenderness  Pelvic:  Bartholin, Urethra, Skene Glands: Within normal limits             Vagina: No gross lesions or discharge  Cervix: No gross lesions or discharge  Uterus  anteverted, normal size, shape and consistency, non-tender and mobile  Adnexa  Without masses or tenderness  Anus and perineum  normal   Rectovaginal  normal sphincter tone without palpated masses  or tenderness             Hemoccult not done     Assessment/Plan:  21 y.o. female for annual exam with positive gonorrhea Chlamydia in June of 2013 did not pick up her medication. Patient is asymptomatic and was counseled as to the risk she has been setting herself up to but by not picking up the medication that was previously prescribed. We discussed her high risk of PID affecting her tubes in future fertility. We  also discussed that her partner needs to be treated. Prescriptions were refilled again today and she will go pick them up and take them as recommended she will return back in one month for test of cure as well for colposcopy and possible biopsy. We discussed importance of monthly self breast examination. Her wet prep was negative with exception moderate bacteria and too numerous to count white blood cell. A CBC and urinalysis was obtained today as well. Patient clearly understands the concerns that I have for her not picking up her medication but she does promises she will take them today and will follow accordingly.   Ok Edwards MD, 9:28 AM 11/03/2011

## 2011-11-04 LAB — URINALYSIS W MICROSCOPIC + REFLEX CULTURE
Bilirubin Urine: NEGATIVE
Casts: NONE SEEN
Crystals: NONE SEEN
Glucose, UA: NEGATIVE mg/dL
Ketones, ur: NEGATIVE mg/dL
Specific Gravity, Urine: 1.026 (ref 1.005–1.030)
Squamous Epithelial / LPF: NONE SEEN

## 2011-11-07 LAB — URINE CULTURE: Colony Count: >=100000

## 2011-11-12 ENCOUNTER — Ambulatory Visit: Payer: BC Managed Care – PPO | Admitting: Gynecology

## 2011-11-24 ENCOUNTER — Encounter: Payer: Self-pay | Admitting: Gynecology

## 2011-11-24 ENCOUNTER — Ambulatory Visit (INDEPENDENT_AMBULATORY_CARE_PROVIDER_SITE_OTHER): Payer: BC Managed Care – PPO | Admitting: Gynecology

## 2011-11-24 VITALS — BP 136/78

## 2011-11-24 DIAGNOSIS — Z113 Encounter for screening for infections with a predominantly sexual mode of transmission: Secondary | ICD-10-CM

## 2011-11-24 NOTE — Progress Notes (Signed)
Patient is a 21 year old who was seen in the office on August 5 for her annual exam. And after review of her record there was evidence that in June of 2013 she was found to have a positive GC and Chlamydia culture but did not pick up her medication to 2 months later. She was placed on Zithromax 1 g along with Suprax 400 mg by mouth. The health department was was notified and her partner was supposed to be tested and treated accordingly. In February of this year she had a Pap smear which demonstrated the following:  LOW GRADE SQUAMOUS INTRAEPITHELIAL LESION: CIN-1/ HPV (LSIL); HOWEVER, THERE ARE A  FEW CELLS SUGGESTIVE OF A HIGHER GRADE LESION  She presented to the office today for test of cure and she will return back to the office next month for colposcopy. Literature information a new guidelines were provided. GC and Chlamydia culture was repeated today. She's currently not sexually active but does have a nexplanon for contraception.

## 2011-11-24 NOTE — Patient Instructions (Signed)
Patient information: Follow-up of low-grade abnormal Pap tests (Beyond the Basics)  Author Clydene Pugh, MD Section Editor Alvera Novel, MD Deputy Editor Morton Amy, MD Disclosures  All topics are updated as new evidence becomes available and our peer review process is complete.  Literature review current through: Jul 2013.  This topic last updated: Aug 15, 2011.  INTRODUCTION - The cervix and vagina are lined by cells called squamous cells.Atypical squamous cells (ASC) is the name given to squamous cells on a Pap test (also called a Pap smear or cervical cytology) that do not have a normal appearance but are not clearly precancerous. Low-grade squamous intraepithelial lesions (LSIL) on a Pap test are cells that appear slightly abnormal. Cervical cancer screening is recommended starting at age 52 years .Women who have ASC or LSIL on a Pap test require further testing because some women with these findings have a precancerous lesion of the cervix. This topic discusses the management of women with ASC and LSIL. The management of women with high-grade squamous intraepithelial lesions (HSIL) and atypical glandular cells (AGC) are discussed in a separate topic.Marland Kitchen) ATYPICAL SQUAMOUS CELLS (ASC) - The ASC designation is subdivided into "atypical cells of undetermined significance" (ASC-US) and ASC-H, in which atypical cells cannot exclude a high-grade squamous intraepithelial lesion. A squamous intraepithelial lesion can be low-grade (low risk of developing into cervical cancer) or high-grade (precancerous cells with a moderate to high risk of developing into cervical cancer). The risk of a high-grade precancerous lesion in women with ASC-US is as high as 18 percent and for those with ASC-H, the risk is as high as 35 percent [1,2]. Cervical cancer screening is recommended starting at age 77 years.  Atypical squamous cells of undetermined significance (ASC-US) - ASC-US Pap tests are managed  differently in women ages 69 to 1 than in those ages 93 and older.  There are two options for women with an ASC-US Pap test who are ages 78 or older: Test for human papillomavirus (HPV) infection. This is the preferred follow-up for ASC-US in women 25 and older. HPV infection is the cause of nearly all cancer of the cervix. There are many strains of HPV, some of which can infect the cervix, and only some of these are high risk for causing cervical precancer or cancer. The HPV testing done by clinicians is only for these high risk strains. Use of testing for high-risk HPV gives important information about whether a woman with an ASC-US Pap test is at risk of cervical cancer. HPV testing is often done at the same time as the Pap test. HPV testing is described in detail in a separate topic. (See "Patient information: Cervical cancer screening (Beyond the Basics)".)  Women who test positive for HPV should have colposcopy. Colposcopy is an examination of the cervix using a type of microscope, which is done in the clinician's office. Colposcopy is discussed in a separate topic. (See "Patient information: Colposcopy (Beyond the Basics)".)  Women who test negative for HPV are not likely to have cervical precancer. These women should have a repeat Pap test and HPV testing in three years. In most cases, the ASC-US resolves during this time.  Repeat the Pap test in one year. If this test is normal, the woman can return to regular screening. If an abnormality is found, then a colposcopy should be done. HPV testing is not a usual part of screening for cervical cancer for women ages 29 to 36. This is because HPV infection is common  in young women, but often goes away and usually does not cause cervical precancer or cancer. For women in this age group with an ASC-US Pap test, there are two options: Repeat the Pap test in one year.  Another option is to do an HPV test. If the HPV test is negative, the woman can return to  regular screening.  Management after colposcopy is discussed separately. (See "Patient information: Management of a cervical biopsy with precancerous cells (Beyond the Basics)".) Atypical squamous cells, cannot rule out a high grade lesion (ASC-H) - This finding requires further evaluation with colposcopy. LOW-GRADE SQUAMOUS LESION (LSIL) - An LSIL Pap test shows mild cellular changes. The risk of a high-grade cervical precancer or cancer after an LSIL Pap test is as high as 19 percent [1,3].  As with an ASC-US Pap test, an LSIL Pap test is evaluated differently depending upon age. For women ages 60 or older, follow-up depends upon the results of human papillomavirus (HPV) testing: Women who test positive for HPV or who have not been tested for HPV should have colposcopy. (See "Patient information: Colposcopy (Beyond the Basics)".)  Women who test negative for HPV can be followed-up with a Pap test and HPV test in one year. As noted above, HPV testing is not a usual part of screening for cervical cancer for women ages 1 to 8. For these women, an LSIL Pap test should be followed-up with another Pap test in one year. Management after colposcopy is discussed separately. (See "Patient information: Management of a cervical biopsy with precancerous cells (Beyond the Basics)".) SPECIAL CIRCUMSTANCES Pregnant women - The evaluation of pregnant women with an abnormal Pap test is based upon ensuring the appropriate evaluation of the woman while avoiding pregnancy-related complications. In pregnant women, for example, a biopsy of the cervix is only done if there is a high concern regarding a precancerous or cancerous lesion.  Atypical squamous cells of undetermined significance (ASC-US) - Pregnant women with ASC-US are managed in the same manner as other women with ASC-US. If colposcopy is needed, this may be performed during or after the pregnancy.  Atypical squamous cells, cannot rule out a high-grade lesion  (ASC-H) - Pregnant women with ASC-H are managed in the same manner as other women with ASC-H.  Low-grade squamous intraepithelial lesion (LSIL) - Pregnant women with LSIL are managed in the same manner as other women with LSIL. If colposcopy is needed, this may be performed during or after the pregnancy.

## 2011-11-25 LAB — GC/CHLAMYDIA PROBE AMP, GENITAL: Chlamydia, DNA Probe: NEGATIVE

## 2011-12-02 ENCOUNTER — Ambulatory Visit: Payer: BC Managed Care – PPO | Admitting: Gynecology

## 2011-12-09 ENCOUNTER — Ambulatory Visit: Payer: BC Managed Care – PPO | Admitting: Gynecology

## 2011-12-12 ENCOUNTER — Encounter: Payer: Self-pay | Admitting: Gynecology

## 2011-12-12 ENCOUNTER — Ambulatory Visit (INDEPENDENT_AMBULATORY_CARE_PROVIDER_SITE_OTHER): Payer: BC Managed Care – PPO | Admitting: Gynecology

## 2011-12-12 VITALS — BP 132/88

## 2011-12-12 DIAGNOSIS — A749 Chlamydial infection, unspecified: Secondary | ICD-10-CM | POA: Insufficient documentation

## 2011-12-12 DIAGNOSIS — D069 Carcinoma in situ of cervix, unspecified: Secondary | ICD-10-CM | POA: Insufficient documentation

## 2011-12-12 DIAGNOSIS — A549 Gonococcal infection, unspecified: Secondary | ICD-10-CM | POA: Insufficient documentation

## 2011-12-12 NOTE — Patient Instructions (Signed)
Patient information: Management of a cervical biopsy with precancerous cells (Beyond the Basics)  Author Greggory Brandy, MD Section Editor Lysbeth Galas, MD Deputy Editor Morton Amy, MD Disclosures  All topics are updated as new evidence becomes available and our peer review process is complete.  Literature review current through: Aug 2013.  This topic last updated: Nov 02, 2011.  INTRODUCTION - Screening for cervical cancer has greatly reduced the rates of cervical cancer. Cervical cancer screening usually consists of a Pap test (also called a Pap smear or cervical cytology) and, in some women, a test for human papillomavirus (HPV), a virus that can cause cervical cancer. Women who are found to have abnormal cells that are precursors to cancer (called a precancerous lesion) of the cervix need further follow-up or treatment. Cervical abnormalities may be referred to as cervical dysplasia, cervical intraepithelial neoplasia (CIN), or adenocarcinoma in situ (AIS).  The outer surface of the cervix has cells called squamous cells. A precancerous lesion affecting these cells is called CIN. These changes are categorized as being mild (CIN 1) or moderate to severe (CIN 2 or 3). The canal of the cervix is lined with glandular cells. A precancerous lesion affecting these cells is called AIS. Precancerous lesions are diagnosed using a cervical biopsy or endocervical curettage (ECC), usually during a colposcopy procedure, which is described elsewhere. (See "Patient information: Colposcopy (Beyond the Basics)".)  Treatments for lesions include cryosurgery (freezing), laser (high-energy light), and excision (surgical removal of the abnormal area, also referred to as a cone biopsy or conization). (See "Patient information: Colposcopy (Beyond the Basics)".) The tests performed to evaluate abnormal Pap tests are discussed separately. (See "Patient information: Cervical cancer screening (Beyond the Basics)" and  "Patient information: Follow-up of low-grade abnormal Pap tests (Beyond the Basics)" and "Patient information: Follow-up of high-grade abnormal Pap tests (Beyond the Basics)".) Treatment of cervical cancer is discussed in another topic. (See "Patient information: Cervical cancer treatment; early stage cancer (Beyond the Basics)".) MANAGEMENT OF LOW-GRADE CIN - Low-grade squamous lesions (cervical intraepithelial neoplasia [CIN] 1) usually resolve, but must be followed to make sure that they do not progress to high-grade lesions or cancer. CIN 1 is managed based upon the Pap test and human papillomavirus (HPV)test findings that preceded them: CIN 1 biopsy in women with an ASC-US or LSIL Pap test or HPV testing that is persistently positive or shows HPV strains 16 or 18 - ASC-US stands for "atypical squamous cells of undetermined significance." LSIL stands for "low-grade squamous intraepithelial lesion." Follow-up is recommended in one year. (See "Patient information: Follow-up of low-grade abnormal Pap tests (Beyond the Basics)".)  For women ages 28 and older, follow-up should be with a Pap test and HPV test in one year. If CIN 1 persists for two years, either continued follow-up or treatment is acceptable.  For women ages 62 to 56, follow-up should be with only a Pap test in one year. HPV testing is not usually part of cervical cancer screening for young women, because HPV infection usually resolves in these women and their risk of high-grade lesions or cervical cancer is low.  CIN 1 biopsy in women with an ASC-H or HSIL Pap test - ASC-H stands for "atypical squamous cells, cannot rule out high grade squamous intraepithelial lesion" and HSIL stands for "high-grade squamous intraepithelial lesion." (See "Patient information: Follow-up of high-grade abnormal Pap tests (Beyond the Basics)".)  For women ages 54 and older, follow-up can be one of three options: (1) Pap test  and HPV test at one year and then two  years; (2) re-review of both Pap test and biopsy results by a pathologist; or (3) immediate treatment with a procedure to remove a larger piece of tissue from the cervix (cone biopsy or loop electrosurgical excision procedure [called LEEP, loop, or LLETZ]).  For women ages 77 to 67, Pap test and colposcopy should be performed every six months.  MANAGEMENT OF HIGH-GRADE CIN - High-grade squamous lesions (cervical intraepithelial neoplasia [CIN] 2 or 3) have a high risk of persisting or developing into cervical cancer over a period of years. In most women, CIN 2 or 3 is treated by removing or destroying the abnormal area. (See 'Choosing the best treatment for abnormal Pap smears' below.)  However, in some women, it is reasonable to delay treatment and instead monitor the abnormal cells. Women younger than 25, for example, can sometimes delay treatment, because high-grade lesions sometimes resolve in women in this age group. Also, pregnant women should delay treatment until after giving birth unless cancerous cell are already present. Women who have not yet had children should be aware that some types of treatment may result in an increased risk of preterm delivery or other complications during a future pregnancy.  MANAGEMENT OF ADENOCARCINOMA IN SITU - Women with a finding on a Pap test of atypical glandular cells (AGC) may be found to have cervical adenocarcinoma or a precancer called adenocarcinoma in situ (AIS). Glandular cancers of the cervix are less common than squamous cancers (cervical intraepithelial neoplasia [CIN] is the precancerous form of squamous cancer).  In some cases, the lab report of a Pap test shows a specific type of AGC. If the type of cells is reported as most likely precancerous (favor neoplasia) or appears to be AIS or adenocarcinoma and the results of a colposcopy procedure are negative, a larger biopsy called a cone biopsy (or a cervical excision) should be performed. This is because  the biopsies taken during colposcopy are from the surface of the cervix or the opening of the cervical canal, but glandular abnormalities may be found higher up in the canal. Cone biopsy procedures are described below. (See 'Excision' below.) If adenocarcinoma is found, this may be treated with an excisional procedure or hysterectomy. Hysterectomy is the preferred treatment in women with AIS who have completed childbearing. For women who elect conservative management with an excisional procedure, repeat excision should be considered if the margins of the initial excision are positive. CHOOSING THE BEST TREATMENT FOR ABNORMAL PAP SMEARS - Abnormal Pap tests are treated by identifying the area of abnormal cervical tissue and removing it to prevent worsening or spread to other areas of the cervix. There are two main types of treatment for cervical abnormalities: Those that destroy the abnormal area (called ablative therapy)  Those that remove the abnormal areas (called excisional therapy, cervical conization, or a cone biopsy) Some abnormalities are best treated with one type of treatment while others can be treated with either type, depending upon the patient and clinician's preference. There are some types of abnormalities that can be followed without treatment, if the clinician and patient are willing. Excisional therapy - Excisional therapies include loop electrosurgical excision procedure (LEEP), also called large loop excision of the transformation zone (LLETZ), laser conization, and cervical conization (sometimes called cold knife conization) procedures. (see 'Excision' below). Excisional therapy is recommended when the extent or type of cervical abnormality is not clear based upon colposcopy and biopsy or when there is a severe abnormality.  In this situation, excision is preferred because the abnormal tissue can be examined with a microscope. This allows the clinician to determine whether the entire  abnormal area was removed and whether a more serious condition (cervical cancer) is present. Ablative therapy - Ablative therapies include cryosurgery and laser ablation. These procedures kill the abnormal cells by freezing or heating them. Ablative therapy may be recommended when there is less concern about cancer or about the extent of the abnormal tissue. EXCISION - Excision is a procedure that cuts out the abnormal area on the surface of the cervix; excision can also remove abnormalities that extend inside the cervical opening. Excision serves two purposes: It provides a sample of tissue to confirm the degree of an abnormality and check for cancerous or precancerous cells deep within the cervix.  Excision helps to ensure that the abnormality is removed completely. If the edges of the tissue that is removed show evidence of the abnormality or precancer, further treatment may be needed. Excision can be done in the clinician's office or operating room after the cervix is injected with local anesthesia to prevent pain. The woman may feel a dull ache or cramp during the procedure. A brown paste is applied after the treatment to prevent bleeding; this often causes a dark vaginal discharge (similar to coffee grounds). Most women are able to return to work or school after the procedure. Following a cervical excision, most women have mild to moderate vaginal bleeding and discharge for one to two weeks. The bleeding should not be heavy (eg, should not soak a pad in less than one hour). Care after excision is described below (see 'Post-procedure care' below). Loop electrosurgical excision procedure (LEEP) - Excision can be done in the clinician's office or in the operating room with a device that uses electrical current; this is called a loop electrosurgical excision procedure (LEEP) or large loop excision of the transformation zone (LLETZ). A thin, wire loop is inserted through the vagina, where it uses an electric  current to remove a cone-shaped portion of the cervix. This can also be performed with a laser knife, which uses high intensity energy from a light beam. Cervical cone biopsy (conization) - Excision can also be done with a scalpel or laser instead of a loop; this is called a cervical conization or cone biopsy (figure 1). Conization is usually done in an operating room after the patient has received general anesthesia (medicine given to induce sleep) or regional anesthesia (eg, epidural or spinal). Complications - As with any surgical procedure, complications can occur during excision. These include: Bleeding during the procedure - Bleeding is rarely serious, and can usually be managed with suturing or by applying cauterizing material (a liquid or treatment that helps the blood to clot) to the cervix.  Bleeding after the procedure - Although light bleeding or spotting is normal, some women have heavy bleeding several days or weeks after the procedure. This can usually be treated in the office, but occasionally a procedure in an operating room is necessary.  Infection - Infections occur rarely after cone biopsy, either on the cervix itself or elsewhere in the reproductive tract. Most infections can be treated with oral antibiotic therapy.  Perforation of the uterus - This is an uncommon complication, and is more likely to occur in women who are postmenopausal or whose uterus is tipped forward. If the uterus is perforated, it usually heals without any need for treatment. Infrequently, additional surgery is needed to see and repair injuries to  internal organs.  Late complications - (see 'Pregnancy after treatment for abnormal Pap smear' below). ABLATIVE TREATMENTS - Ablative treatments destroy, rather then cut away, abnormal cervical tissue. Cryosurgery - Cryosurgery involves applying liquid nitrogen or carbon dioxide to the cervix. This causes the cervical tissue to freeze, which destroys the abnormal cells.  Cryosurgery can be done in the office, similar to a pelvic examination, without any anesthesia. It may cause mild cramping or discomfort. Cryosurgery is not recommended in certain situations, such as when the extent and type of cervical abnormality are not clear based upon colposcopy and/or biopsy. Excisional therapy is preferred in these cases. Most women have watery vaginal discharge for one week after cryosurgery. Care after cryosurgery is described below (see 'Post-procedure care' below). Laser ablation - Laser ablation uses high intensity energy from a light beam to destroy abnormal areas of the cervix. The laser is directed to the abnormal area of the cervix through the vagina. This is usually performed in an operating room after the woman has received general anesthesia or regional anesthesia (eg, epidural or spinal). Laser treatment requires special training and equipment. Like cryosurgery, laser ablation destroys the abnormal tissue, which means that the tissue cannot be examined under a microscope and analyzed. As a result, laser ablation is not recommended in certain situations, such as when the extent and type of cervical abnormality are not clear based upon colposcopy and/or biopsy. Most women have vaginal discharge for one to two weeks after laser treatment. Care after laser treatment is described below (see 'Post-procedure care' below). POST-PROCEDURE CARE - All women should ask about their ability to drive home from the procedure and when they can resume normal daily activities. Following treatment, most providers recommend avoiding sexual intercourse, not placing anything in the vagina (eg, douches, tampons), and not taking a bath or swim for a few weeks (showers are fine); other clinicians may recommend a shorter period of "pelvic rest". This should be discussed in detail with the clinician. In general, a woman should call her provider if she has bleeding that is heavier than a normal  menstrual period (defined as soaking a pad in less than one hour, especially if there are clots), severe or worsening pain, fever over 101F (38.4C), or a foul-smelling vaginal discharge. Treatment efficacy - Although the treatments described above are effective, recurrence or persistence of cervical dysplasia is common and occurs in up to 30 percent of women. Women that are not cured after a first treatment may have persistence, recurrence, or progression of the abnormality, especially if a high risk type of human papillomavirus (HPV, types 16 and 18) is present. Additional treatment is sometimes needed in this case. For this reason, lifelong follow up with cervical cytology smears (Pap test) is important. Follow up appointments - Typically, a woman is seen for a follow up examination several weeks after treatment to make sure the cervix is healing. The type of follow-up and time interval between subsequent tests will depend upon the results of the initial testing after treatment and the woman's age. Follow up is best discussed with a woman's individual provider since it may vary significantly from one woman to another. Need for further treatment - Some women will require additional treatments to ensure that all abnormal areas are removed. This is especially true if excision was done and microscopic analysis showed a larger abnormality than was expected. The decision to have additional treatment is individualized, based upon the type of abnormality seen, the woman's risk of cervical cancer,  and whether or not childbearing is completed

## 2011-12-12 NOTE — Progress Notes (Signed)
Patient ID: Sara Benson, female   DOB: [birth year], 21 y.o.   MRN: [id number]  The patient and 21 year old who presented to the office today as a result of her abnormal Pap smear. 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 Endocervix, curettage - BENIGN ENDOCERVICAL GLANDS AND ABUNDANT MUCIN. 2. Cervix, biopsy, 10 o'clock - HIGH GRADE SQUAMOUS INTRAEPITHELIAL LESION, CIN-III (SEVERE DYSPLASIA/CIS). 3. Cervix, biopsy, 4 o'clock - HIGH GRADE SQUAMOUS INTRAEPITHELIAL LESION, CIN-II (MODERATE DYSPLASIA). 4. Cervix, biopsy, 10 o'clock - HIGH GRADE SQUAMOUS INTRAEPITHELIAL LESION, CIN II-III (MODERATE/SEVERE DYSPLASIA/CIS).  Patient underwent recall scopic evaluation today since she has been poor compliance and a did not return shortly thereafter the above colposcopic directed biopsy. She was recently treated for GC and Chlamydia culture and was poor compliance and did not take her medication to 2 months later. Her followup test of cure was negative. She states that her ex-partner was evaluated and treated as well.  Colposcopic evaluation today:  Physical Exam  Genitourinary:     The areas were biopsied were similar to the areas seen in February. Patient will return back to the office next week for consultation for possible LEEP cervical conization or outpatient CO2 laser. Colposcopic directed biopsies were undertaken submitted for histological evaluation. Monsel solution was used for hemostasis. Patient has a nexplanon for contraception. Literature information on the management of high-grade dysplasia in her age group was provided for her to read before her office visit next week. We'll wait for pathology  report and discuss case the GYN oncologist

## 2011-12-18 ENCOUNTER — Ambulatory Visit: Payer: BC Managed Care – PPO | Admitting: Gynecology

## 2011-12-30 ENCOUNTER — Encounter: Payer: Self-pay | Admitting: Gynecology

## 2011-12-30 ENCOUNTER — Ambulatory Visit (INDEPENDENT_AMBULATORY_CARE_PROVIDER_SITE_OTHER): Payer: BC Managed Care – PPO | Admitting: Gynecology

## 2011-12-30 VITALS — BP 130/86

## 2011-12-30 DIAGNOSIS — N871 Moderate cervical dysplasia: Secondary | ICD-10-CM

## 2011-12-30 DIAGNOSIS — Z01818 Encounter for other preprocedural examination: Secondary | ICD-10-CM

## 2011-12-30 NOTE — Patient Instructions (Addendum)
CO2 laser - Laser surgery should only be performed by physicians with specialized training. The laser is directed at the cervical lesion under colposcopic guidance. Water in the tissue absorbs the laser energy, which destroys the tissue by vaporization. To be effective, the lesion is typically ablated to a depth of 5 mm on the ectocervix and 8 to 9 mm around the endocervix. Laser ablation of visible lesions is discussed here, a full discussion of laser conization (ie, the transformation zone is excised using a laser) and principles of laser therapy can be found separately. (See "Cervical intraepithelial neoplasia: Procedures for cervical conization", section on 'Laser conization' and "Basic principles of medical lasers".) Technique - Protective eye wear is necessary for any personnel in the procedure room. To avoid inadvertent damage to the surrounding areas from a reflected or misdirected laser beam, use a blackened or brushed speculum and drape the patient's perineum and thighs with wet towels. Avoid paper drapes, as they are flammable.  Visualize the cervix using a colposcope. Using a CO2 laser with a power density of 600 to 1200 watts/cm2 is adequate for vaporization. For example, with a spot size of 2 mm, setting the laser to 35 watts will produce a power density of 875 watts/cm2. In generally, obtaining hemostasis requires a spot size of 5 mm or greater. Technique varies among surgeons, and there are no data comparing these methods. Some experts advocate using the laser in ultrapulse mode to decrease thermal damage to the surrounding cervical stroma. Also, some experts advise lasering the ectocervix with a defocused beam to provide a several millimeter margin around the area previously ablated (ie, "brush" lasering).

## 2011-12-30 NOTE — Progress Notes (Signed)
Sara Benson is an 21 y.o. female. 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 Endocervix, curettage  - BENIGN ENDOCERVICAL GLANDS AND ABUNDANT MUCIN.  2. Cervix, biopsy, 10 o'clock  - HIGH GRADE SQUAMOUS INTRAEPITHELIAL LESION, CIN-III (SEVERE DYSPLASIA/CIS).  3. Cervix, biopsy, 4 o'clock  - HIGH GRADE SQUAMOUS INTRAEPITHELIAL LESION, CIN-II (MODERATE DYSPLASIA).  4. Cervix, biopsy, 10 o'clock  - HIGH GRADE SQUAMOUS INTRAEPITHELIAL LESION, CIN II-III (MODERATE/SEVERE  DYSPLASIA/CIS). And  Patient was seen in the office again on September 13 and underwent a repeat colposcopic evaluation since she had been poor compliant and not return to the office after the last biopsy to discuss treatment options. She was treated earlier in the year for [facility] and Chlamydia culture in her followup test of cure cultures were negative.    See pictures from last colposcopic evaluation September 13 in epic. Pathology report from colposcopic directed biopsies on September 13 as follows:  Diagnosis 1. Endocervix, curettage, C&B - DETACHED FRAGMENTS OF HIGH GRADE SQUAMOUS INTRAEPITHELIAL LESION, CIN-II (MODERATE DYSPLASIA). - BENIGN ENDOCERVICAL MUCOSA. 2. Cervix, biopsy, 2 o'clock - HIGH GRADE SQUAMOUS INTRAEPITHELIAL LESION, CIN-II (MODERATE DYSPLASIA) SEE COMMENT. 3. Cervix, biopsy, 4 o'clock - LOW GRADE SQUAMOUS INTRAEPITHELIAL LESION, CIN-I (MILD DYSPLASIA). 4. Cervix, biopsy, 11 o'clock - LOW GRADE SQUAMOUS INTRAEPITHELIAL LESION, CIN-I (MILD DYSPLASIA).     Pertinent Gynecological  History: Menses: Regular Bleeding: Regular Contraception: Nexplanon DES exposure: denies Blood transfusions: none Sexually transmitted diseases: Treated for gonorrhea and Chlamydia June of 2013 Previous GYN Procedures: Placement of the nexplanon  Last mammogram: Not indicated Date: Not indicated Last pap: See above Date: See above OB History: G 1, P 1   Menstrual History: Menarche age: 70 No LMP recorded. Patient has had an implant.    Past Medical History  Diagnosis Date  . Hypertension   . Normal spontaneous vaginal delivery   . Chlamydia 07/18/10  . GC (gonococcus) 10/2011  . Chlamydial cervicitis 10/2011    Past Surgical History  Procedure Date  . Tonsillectomy and adenoidectomy     Family History  Problem Relation Age of Onset  . Hypertension Mother     Social History:  reports that she has quit smoking. She has never used smokeless tobacco. She reports that she does not drink alcohol or use illicit drugs.  Allergies: No Known Allergies   (Not in a hospital admission)  REVIEW OF SYSTEMS: A ROS was performed and pertinent positives and negatives are included in the history.  GENERAL: No fevers or chills. HEENT: No change in vision, no earache, sore throat or sinus congestion. NECK: No pain or stiffness. CARDIOVASCULAR: No chest pain or pressure. No palpitations. PULMONARY: No shortness of breath, cough or wheeze. GASTROINTESTINAL: No abdominal pain, nausea, vomiting or diarrhea, melena or bright red blood per rectum. GENITOURINARY: No urinary frequency, urgency, hesitancy or dysuria. MUSCULOSKELETAL: No joint or muscle pain, no back pain, no recent trauma. DERMATOLOGIC: No rash, no itching, no lesions. ENDOCRINE: No polyuria, polydipsia, no heat or cold intolerance. No recent change in weight. HEMATOLOGICAL: No anemia or easy bruising or bleeding. NEUROLOGIC: No headache, seizures, numbness, tingling or weakness. PSYCHIATRIC: No depression, no loss of interest  in normal  activity or change in sleep pattern.     Blood pressure 130/86.  Physical Exam:  HEENT:unremarkable Neck:Supple, midline, no thyroid megaly, no carotid bruits Lungs:  Clear to auscultation no rhonchi's or wheezes Heart:Regular rate and rhythm, no murmurs or gallops Breast Exam: Unremarkable Abdomen: Soft nontender no rebound or guarding Pelvic:BUS within normal limits Vagina: No gross lesions oral colposcopic exam Cervix: As described above Uterus: Anteverted normal size shape and consistency Adnexa: No palpable masses or tenderness Extremities: No cords, no edema Rectal: Not done  No results found for this or any previous visit (from the past 24 hour(s)).  No results found.  Assessment/Plan: Patient with cervical dysplasia (CIN-1/CIN-2). Patient scheduled to undergo CO2 laser ablation of cervical dysplasia in an outpatient setting. The risks benefits and pros and cons of the operation were discussed and literature information was provided.                        Patient was counseled as to the risk of surgery to include the following:  1. Infection (prohylactic antibiotics will be administered)  2. DVT/Pulmonary Embolism (prophylactic pneumo compression stockings will be used)  3.Trauma to internal organs requiring additional surgical procedure to repair any injury to     Internal organs requiring perhaps additional hospitalization days.  4.Hemmorhage requiring transfusion and blood products which carry risks such as anaphylactic reaction, hepatitis and AIDS  Patient had received literature information on the procedure scheduled and all her questions were answered in her native tongue and accepts all risk.  [facility] HMD2:39 PMTD@

## 2011-12-31 ENCOUNTER — Telehealth: Payer: Self-pay | Admitting: Gynecology

## 2011-12-31 NOTE — Telephone Encounter (Signed)
I spoke with patient and informed her surgery scheduled for Oct 10,1:00pm at [facility].  Pamphlet mailed.

## 2012-01-05 ENCOUNTER — Encounter (HOSPITAL_BASED_OUTPATIENT_CLINIC_OR_DEPARTMENT_OTHER): Payer: Self-pay | Admitting: *Deleted

## 2012-01-05 NOTE — Progress Notes (Signed)
NPO AFTER MN WITH EXCEPTION WATER/ GATORADE UNTIL 0700. ARRIVES AT 1145. PT TO COME 01-06-2012 FOR LAB WORK. WILL TAKE ATENOLOL AM OF SURG W/ SIP OF WATER.

## 2012-01-07 LAB — BASIC METABOLIC PANEL
Calcium: 9.5 mg/dL (ref 8.4–10.5)
Creatinine, Ser: 0.58 mg/dL (ref 0.50–1.10)
GFR calc Af Amer: >90 mL/min (ref >90)
GFR calc non Af Amer: >90 mL/min (ref >90)
Sodium: 138 mEq/L (ref 135–145)

## 2012-01-07 LAB — CBC
Hemoglobin: 14.1 g/dL (ref 12.0–15.0)
MCH: 29.2 pg (ref 26.0–34.0)
MCHC: 36.4 g/dL — ABNORMAL HIGH (ref 30.0–36.0)
Platelets: 318 10*3/uL (ref 150–400)
RBC: 4.83 MIL/uL (ref 3.87–5.11)

## 2012-01-08 ENCOUNTER — Ambulatory Visit (HOSPITAL_BASED_OUTPATIENT_CLINIC_OR_DEPARTMENT_OTHER): Payer: BC Managed Care – PPO | Admitting: Anesthesiology

## 2012-01-08 ENCOUNTER — Ambulatory Visit (HOSPITAL_BASED_OUTPATIENT_CLINIC_OR_DEPARTMENT_OTHER)
Admission: RE | Admit: 2012-01-08 | Discharge: 2012-01-08 | Disposition: A | Discharge status: Home / Self Care | Payer: BC Managed Care – PPO | Source: Ambulatory Visit | Attending: Gynecology | Admitting: Gynecology

## 2012-01-08 ENCOUNTER — Encounter (HOSPITAL_BASED_OUTPATIENT_CLINIC_OR_DEPARTMENT_OTHER): Payer: Self-pay | Admitting: Anesthesiology

## 2012-01-08 ENCOUNTER — Encounter (HOSPITAL_BASED_OUTPATIENT_CLINIC_OR_DEPARTMENT_OTHER)
Admission: RE | Disposition: A | Discharge status: Home / Self Care | Payer: Self-pay | Source: Ambulatory Visit | Attending: Gynecology

## 2012-01-08 ENCOUNTER — Encounter (HOSPITAL_BASED_OUTPATIENT_CLINIC_OR_DEPARTMENT_OTHER): Payer: Self-pay | Admitting: *Deleted

## 2012-01-08 DIAGNOSIS — D069 Carcinoma in situ of cervix, unspecified: Secondary | ICD-10-CM

## 2012-01-08 DIAGNOSIS — N871 Moderate cervical dysplasia: Secondary | ICD-10-CM | POA: Insufficient documentation

## 2012-01-08 DIAGNOSIS — I10 Essential (primary) hypertension: Secondary | ICD-10-CM | POA: Insufficient documentation

## 2012-01-08 HISTORY — DX: Personal history of other infectious and parasitic diseases: Z86.19

## 2012-01-08 LAB — URINALYSIS, ROUTINE W REFLEX MICROSCOPIC
Ketones, ur: NEGATIVE mg/dL
Nitrite: NEGATIVE
Protein, ur: NEGATIVE mg/dL

## 2012-01-08 LAB — POCT PREGNANCY, URINE: Preg Test, Ur: NEGATIVE

## 2012-01-08 LAB — URINE MICROSCOPIC-ADD ON

## 2012-01-08 SURGERY — CO2 LASER APPLICATION
Anesthesia: General | Site: Cervix | Wound class: Clean Contaminated

## 2012-01-08 MED ORDER — LACTATED RINGERS IV SOLN
INTRAVENOUS | Status: DC
  Administered 2012-01-08: 12:00:00 via INTRAVENOUS

## 2012-01-08 MED ORDER — MEPERIDINE HCL 25 MG/ML IJ SOLN: 6.2500 mg | INTRAMUSCULAR | Status: DC | PRN

## 2012-01-08 MED ORDER — ACETIC ACID 5 % SOLN
Status: DC | PRN
  Administered 2012-01-08: 1 via TOPICAL

## 2012-01-08 MED ORDER — LIDOCAINE HCL (CARDIAC) 20 MG/ML IV SOLN
INTRAVENOUS | Status: DC | PRN
  Administered 2012-01-08: 100 mg via INTRAVENOUS

## 2012-01-08 MED ORDER — MIDAZOLAM HCL 5 MG/5ML IJ SOLN
INTRAMUSCULAR | Status: DC | PRN
  Administered 2012-01-08: 2 mg via INTRAVENOUS

## 2012-01-08 MED ORDER — LACTATED RINGERS IV SOLN: INTRAVENOUS | Status: DC

## 2012-01-08 MED ORDER — ESTRADIOL 0.1 MG/GM VA CREA
TOPICAL_CREAM | VAGINAL | Status: DC | PRN
  Administered 2012-01-08: 1 via VAGINAL

## 2012-01-08 MED ORDER — DEXAMETHASONE SODIUM PHOSPHATE 4 MG/ML IJ SOLN
INTRAMUSCULAR | Status: DC | PRN
  Administered 2012-01-08: 10 mg via INTRAVENOUS

## 2012-01-08 MED ORDER — OXYCODONE-ACETAMINOPHEN 5-500 MG PO CAPS: 1.0000 | ORAL_CAPSULE | ORAL | Status: DC | PRN

## 2012-01-08 MED ORDER — FENTANYL CITRATE 0.05 MG/ML IJ SOLN
INTRAMUSCULAR | Status: DC | PRN
  Administered 2012-01-08: 25 ug via INTRAVENOUS
  Administered 2012-01-08: 50 ug via INTRAVENOUS
  Administered 2012-01-08 (×3): 25 ug via INTRAVENOUS
  Administered 2012-01-08: 100 ug via INTRAVENOUS
  Administered 2012-01-08: 50 ug via INTRAVENOUS

## 2012-01-08 MED ORDER — KETOROLAC TROMETHAMINE 30 MG/ML IJ SOLN
INTRAMUSCULAR | Status: DC | PRN
  Administered 2012-01-08: 30 mg via INTRAVENOUS

## 2012-01-08 MED ORDER — LACTATED RINGERS IV SOLN
INTRAVENOUS | Status: DC | PRN
  Administered 2012-01-08 (×2): via INTRAVENOUS

## 2012-01-08 MED ORDER — SUCCINYLCHOLINE CHLORIDE 20 MG/ML IJ SOLN
INTRAMUSCULAR | Status: DC | PRN
  Administered 2012-01-08: 10 mg via INTRAVENOUS
  Administered 2012-01-08: 20 mg via INTRAVENOUS

## 2012-01-08 MED ORDER — FENTANYL CITRATE 0.05 MG/ML IJ SOLN: 25.0000 ug | INTRAMUSCULAR | Status: DC | PRN

## 2012-01-08 MED ORDER — PROPOFOL 10 MG/ML IV BOLUS
INTRAVENOUS | Status: DC | PRN
  Administered 2012-01-08: 100 mg via INTRAVENOUS
  Administered 2012-01-08: 200 mg via INTRAVENOUS
  Administered 2012-01-08: 100 mg via INTRAVENOUS

## 2012-01-08 MED ORDER — ONDANSETRON HCL 4 MG/2ML IJ SOLN
INTRAMUSCULAR | Status: DC | PRN
  Administered 2012-01-08: 4 mg via INTRAVENOUS

## 2012-01-08 MED ORDER — PROMETHAZINE HCL 25 MG/ML IJ SOLN: 6.2500 mg | INTRAMUSCULAR | Status: DC | PRN

## 2012-01-08 MED ORDER — CEFAZOLIN SODIUM-DEXTROSE 2-3 GM-% IV SOLR: 2.0000 g | INTRAVENOUS | Status: DC

## 2012-01-08 MED ORDER — CEFAZOLIN SODIUM-DEXTROSE 2-3 GM-% IV SOLR
INTRAVENOUS | Status: DC | PRN
  Administered 2012-01-08: 2 g via INTRAVENOUS

## 2012-01-08 SURGICAL SUPPLY — 45 items
APPLICATOR COTTON TIP 6IN STRL (MISCELLANEOUS) ×2 IMPLANT
BLADE SURG 15 STRL LF DISP TIS (BLADE) ×1 IMPLANT
BLADE SURG 15 STRL SS (BLADE) ×1
CANISTER SUCTION 1200CC (MISCELLANEOUS) ×2 IMPLANT
CANISTER SUCTION 2500CC (MISCELLANEOUS) IMPLANT
CATH ROBINSON RED A/P 14FR (CATHETERS) ×2 IMPLANT
CLOTH BEACON ORANGE TIMEOUT ST (SAFETY) ×2 IMPLANT
COVER TABLE BACK 60X90 (DRAPES) ×2 IMPLANT
DEPRESSOR TONGUE BLADE STERILE (MISCELLANEOUS) ×2 IMPLANT
DRAPE LG THREE QUARTER DISP (DRAPES) ×2 IMPLANT
DRAPE UNDERBUTTOCKS STRL (DRAPE) ×2 IMPLANT
ELECT BALL LEEP 3MM BLK (ELECTRODE) ×2 IMPLANT
ELECT NEEDLE TIP 2.8 STRL (NEEDLE) IMPLANT
ELECT REM PT RETURN 9FT ADLT (ELECTROSURGICAL) ×2
ELECTRODE REM PT RTRN 9FT ADLT (ELECTROSURGICAL) ×1 IMPLANT
GLOVE ECLIPSE 7.5 STRL STRAW (GLOVE) ×2 IMPLANT
GLOVE INDICATOR 8.0 STRL GRN (GLOVE) ×2 IMPLANT
GOWN PREVENTION PLUS LG XLONG (DISPOSABLE) ×2 IMPLANT
GOWN STRL REIN XL XLG (GOWN DISPOSABLE) ×2 IMPLANT
LEGGING LITHOTOMY PAIR STRL (DRAPES) ×2 IMPLANT
NDL SAFETY ECLIPSE 18X1.5 (NEEDLE) IMPLANT
NEEDLE HYPO 18GX1.5 SHARP (NEEDLE)
NS IRRIG 500ML POUR BTL (IV SOLUTION) IMPLANT
PACK BASIN DAY SURGERY FS (CUSTOM PROCEDURE TRAY) ×2 IMPLANT
PAD OB MATERNITY 4.3X12.25 (PERSONAL CARE ITEMS) ×2 IMPLANT
PAD PREP 24X48 CUFFED NSTRL (MISCELLANEOUS) ×2 IMPLANT
PENCIL BUTTON HOLSTER BLD 10FT (ELECTRODE) IMPLANT
SCOPETTES 8  STERILE (MISCELLANEOUS) ×1
SCOPETTES 8 STERILE (MISCELLANEOUS) ×1 IMPLANT
SUT VIC AB 2-0 PS2 27 (SUTURE) ×4 IMPLANT
SUT VIC AB 3-0 CT1 27 (SUTURE)
SUT VIC AB 3-0 CT1 27XBRD (SUTURE) IMPLANT
SUT VIC AB 3-0 FS2 27 (SUTURE) ×4 IMPLANT
SUT VIC AB 3-0 X1 27 (SUTURE) IMPLANT
SWAB CULTURE LIQ STUART DBL (MISCELLANEOUS) IMPLANT
SYR TB 1ML LL NO SAFETY (SYRINGE) IMPLANT
SYRINGE IRR TOOMEY STRL 70CC (SYRINGE) IMPLANT
TOWEL OR 17X24 6PK STRL BLUE (TOWEL DISPOSABLE) ×4 IMPLANT
TRAY DSU PREP LF (CUSTOM PROCEDURE TRAY) ×2 IMPLANT
TUBE ANAEROBIC SPECIMEN COL (MISCELLANEOUS) IMPLANT
TUBE CONNECTING 12X1/4 (SUCTIONS) ×2 IMPLANT
VACUUM HOSE 7/8X10 W/ WAND (MISCELLANEOUS) ×2 IMPLANT
VACUUM HOSE/TUBING 7/8INX6FT (MISCELLANEOUS) ×2 IMPLANT
WATER STERILE IRR 500ML POUR (IV SOLUTION) ×2 IMPLANT
YANKAUER SUCT BULB TIP NO VENT (SUCTIONS) IMPLANT

## 2012-01-08 NOTE — H&P (View-Only) (Signed)
Sara Benson is an 21 y.o. female. 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 Endocervix, curettage  - BENIGN ENDOCERVICAL GLANDS AND ABUNDANT MUCIN.  2. Cervix, biopsy, 10 o'clock  - HIGH GRADE SQUAMOUS INTRAEPITHELIAL LESION, CIN-III (SEVERE DYSPLASIA/CIS).  3. Cervix, biopsy, 4 o'clock  - HIGH GRADE SQUAMOUS INTRAEPITHELIAL LESION, CIN-II (MODERATE DYSPLASIA).  4. Cervix, biopsy, 10 o'clock  - HIGH GRADE SQUAMOUS INTRAEPITHELIAL LESION, CIN II-III (MODERATE/SEVERE  DYSPLASIA/CIS). And  Patient was seen in the office again on September 13 and underwent a repeat colposcopic evaluation since she had been poor compliant and not return to the office after the last biopsy to discuss treatment options. She was treated earlier in the year for GC and Chlamydia culture in her followup test of cure cultures were negative.    See pictures from last colposcopic evaluation September 13 in epic. Pathology report from colposcopic directed biopsies on September 13 as follows:  Diagnosis 1. Endocervix, curettage, C&B - DETACHED FRAGMENTS OF HIGH GRADE SQUAMOUS INTRAEPITHELIAL LESION, CIN-II (MODERATE DYSPLASIA). - BENIGN ENDOCERVICAL MUCOSA. 2. Cervix, biopsy, 2 o'clock - HIGH GRADE SQUAMOUS INTRAEPITHELIAL LESION, CIN-II (MODERATE DYSPLASIA) SEE COMMENT. 3. Cervix, biopsy, 4 o'clock - LOW GRADE SQUAMOUS INTRAEPITHELIAL LESION, CIN-I (MILD DYSPLASIA). 4. Cervix, biopsy, 11 o'clock - LOW GRADE SQUAMOUS INTRAEPITHELIAL LESION, CIN-I (MILD DYSPLASIA).     Pertinent Gynecological  History: Menses: Regular Bleeding: Regular Contraception: Nexplanon DES exposure: denies Blood transfusions: none Sexually transmitted diseases: Treated for gonorrhea and Chlamydia June of 2013 Previous GYN Procedures: Placement of the nexplanon  Last mammogram: Not indicated Date: Not indicated Last pap: See above Date: See above OB History: G 1, P 1   Menstrual History: Menarche age: 13 No LMP recorded. Patient has had an implant.    Past Medical History  Diagnosis Date  . Hypertension   . Normal spontaneous vaginal delivery   . Chlamydia 07/18/10  . GC (gonococcus) 10/2011  . Chlamydial cervicitis 10/2011    Past Surgical History  Procedure Date  . Tonsillectomy and adenoidectomy     Family History  Problem Relation Age of Onset  . Hypertension Mother     Social History:  reports that she has quit smoking. She has never used smokeless tobacco. She reports that she does not drink alcohol or use illicit drugs.  Allergies: No Known Allergies   (Not in a hospital admission)  REVIEW OF SYSTEMS: A ROS was performed and pertinent positives and negatives are included in the history.  GENERAL: No fevers or chills. HEENT: No change in vision, no earache, sore throat or sinus congestion. NECK: No pain or stiffness. CARDIOVASCULAR: No chest pain or pressure. No palpitations. PULMONARY: No shortness of breath, cough or wheeze. GASTROINTESTINAL: No abdominal pain, nausea, vomiting or diarrhea, melena or bright red blood per rectum. GENITOURINARY: No urinary frequency, urgency, hesitancy or dysuria. MUSCULOSKELETAL: No joint or muscle pain, no back pain, no recent trauma. DERMATOLOGIC: No rash, no itching, no lesions. ENDOCRINE: No polyuria, polydipsia, no heat or cold intolerance. No recent change in weight. HEMATOLOGICAL: No anemia or easy bruising or bleeding. NEUROLOGIC: No headache, seizures, numbness, tingling or weakness. PSYCHIATRIC: No depression, no loss of interest   in normal  activity or change in sleep pattern.     Blood pressure 130/86.  Physical Exam:  HEENT:unremarkable Neck:Supple, midline, no thyroid megaly, no carotid bruits Lungs:  Clear to auscultation no rhonchi's or wheezes Heart:Regular rate and rhythm, no murmurs or gallops Breast Exam: Unremarkable Abdomen: Soft nontender no rebound or guarding Pelvic:BUS within normal limits Vagina: No gross lesions oral colposcopic exam Cervix: As described above Uterus: Anteverted normal size shape and consistency Adnexa: No palpable masses or tenderness Extremities: No cords, no edema Rectal: Not done  No results found for this or any previous visit (from the past 24 hour(s)).  No results found.  Assessment/Plan: Patient with cervical dysplasia (CIN-1/CIN-2). Patient scheduled to undergo CO2 laser ablation of cervical dysplasia in an outpatient setting. The risks benefits and pros and cons of the operation were discussed and literature information was provided.                        Patient was counseled as to the risk of surgery to include the following:  1. Infection (prohylactic antibiotics will be administered)  2. DVT/Pulmonary Embolism (prophylactic pneumo compression stockings will be used)  3.Trauma to internal organs requiring additional surgical procedure to repair any injury to     Internal organs requiring perhaps additional hospitalization days.  4.Hemmorhage requiring transfusion and blood products which carry risks such as anaphylactic reaction, hepatitis and AIDS  Patient had received literature information on the procedure scheduled and all her questions were answered in her native tongue and accepts all risk.  Amando Ishikawa HMD2:39 PMTD@    

## 2012-01-08 NOTE — Interval H&P Note (Signed)
History and Physical Interval Note:  01/08/2012 1:05 PM  Sara Benson  has presented today for surgery, with the diagnosis of cervical dysplasia  The various methods of treatment have been discussed with the patient and family. After consideration of risks, benefits and other options for treatment, the patient has consented to  Procedure(s) (LRB) with comments: CO2 LASER APPLICATION (N/A) - Laser Ablation of Cervical Dysplasia as a surgical intervention .  The patient's history has been reviewed, patient examined, no change in status, stable for surgery.  I have reviewed the patient's chart and labs.  Questions were answered to the patient's satisfaction.     Ok Edwards

## 2012-01-08 NOTE — Op Note (Signed)
01/08/2012  1:55 PM  PATIENT:  Sara Benson  21 y.o. female  PRE-OPERATIVE DIAGNOSIS:  cervical dysplasia (CIN 1-CIN 2)  POST-OPERATIVE DIAGNOSIS:  cervical dysplagia (CIN-1-CIN 2)  PROCEDURE:  Procedure(s): CO2 LASER APPLICATION of cervical dysplasia  SURGEON:  Surgeon(s): Ok Edwards, MD  ANESTHESIA:   general  FINDINGS: Acetowhite areas were noted at the 2:00 4 and 11:00 position  DESCRIPTION OF OPERATION: The patient was taken to the operating room where she underwent successful general endotracheal anesthesia. Time out was accomplished to properly identify the patient and the indicated procedure. She received 2 g Cefotan prophylactically and had PAS stockings for DVT prophylaxis. Patient was then placed in the high lithotomy position and the vulva and perineal area was prepped and draped in usual sterile fashion. A red Roxan Hockey was inserted into the bladder to evacuate approximately 50 cc of urine. A coated speculum was inserted into the vagina with the aid of coated sidewall retractors for exposure. A systematic inspection prior to this of the external genitalia perineal body perirectal region and vagina demonstrated no additional lesions. Acetic acid was applied to enhance the areas that were previously biopsied before commencement of the laser ablation of the cervix. CO2 laser was set at 6 W was utilized to demarcate in a circumferential manner the area of the ectocervix several millimeters beyond the acetowhite areas that had been noted and previously biopsied. In a brush-like technique method the ectocervix to include the transformation zone was ablated for a depth of approximately 6 mm. After completion of the operation Estrace vaginal cream was applied at the cervical bed that was ablated. The patient was extubated after the instrument was removed and transferred to recovery room with  stable vital signs.  ESTIMATED BLOOD LOSS: Minimal  Intake/Output Summary (Last 24  hours) at 01/08/12 1355 Last data filed at 01/08/12 1330  Gross per 24 hour  Intake   1000 ml  Output      0 ml  Net   1000 ml     BLOOD ADMINISTERED:none   LOCAL MEDICATIONS USED:  NONE  SPECIMEN:  Source of Specimen:  None  DISPOSITION OF SPECIMEN:  N/A  COUNTS:  YES  PLAN OF CARE: Transfer to PACU  [facility] HMD1:55 PMTD@

## 2012-01-08 NOTE — Anesthesia Postprocedure Evaluation (Signed)
Anesthesia Post Note  Patient: Sara Benson  Procedure(s) Performed: Procedure(s) (LRB): CO2 LASER APPLICATION (N/A)  Anesthesia type: General  Patient location: PACU  Post pain: Pain level controlled  Post assessment: Post-op Vital signs reviewed  Last Vitals: BP 116/42  Pulse 112  Temp 36.4 C (Oral)  Resp 18  Ht 5\' 7"  (1.702 m)  Wt 235 lb (106.595 kg)  BMI 36.81 kg/m2  SpO2 97%  Post vital signs: Reviewed  Level of consciousness: sedated  Complications: No apparent anesthesia complications

## 2012-01-08 NOTE — Discharge Instructions (Addendum)
Postop appointment with Dr. Lily Peer on October 31 at 9:20 AM  General Postsurgical Instructions, Adult  You may expect to feel dizzy, weak, and drowsy for as long as 24 hours after receiving the medicine that made you sleep (anesthetic). The following information pertains to your recovery period for the first 24 hours following surgery.  Do not drive a car, ride a bicycle, participate in physical activities, or take public transportation until you are done taking narcotic pain medicines or as directed by your caregiver.   Do not drink alcohol or take tranquilizers.   Do not take medicine that has not been prescribed by your caregiver.   Do not sign important papers or make important decisions while on narcotic pain medicines.   Have a responsible person with you.   nes for pain, discomfort, or fever as directed by your caregiver. Do not take aspirin. It can make you bleed. Take medicines (antibiotics) that kill germs as directed.  Only take over-the-counter or prescription medici  You feel sick to your stomach (nauseous).   You start to throw up (vomit).   You have trouble eating or drinking.   You have an oral temperature above 100.   You have constipation that is not helped by adjusting diet or increasing fluid intake. Pain medicines are a common cause of constipation.  SEEK IMMEDIATE MEDICAL CARE IF:  You have persistent dizziness.   You have difficulty breathing or a congested sounding (croupy) cough.   You have an oral temperature above 100, not controlled by medicine.   There is increasing pain or tenderness near or in the surgical site.   Only shower baths and no intercourse until after postop appointment and no vaginal douching  Apply the Estrace vaginal cream every night a small application for the next 7-10 days. Document Released: 06/29/2000 Document Re-Released: 06/11/2009 [facility] Patient Information 2011 [place], [place].   [facility] HMD1:50 PM Post  Anesthesia Home Care Instructions  Activity: Get plenty of rest for the remainder of the day. A responsible adult should stay with you for 24 hours following the procedure.  For the next 24 hours, DO NOT: -Drive a car -Advertising copywriter -Drink alcoholic beverages -Take any medication unless instructed by your physician -Make any legal decisions or sign important papers.  Meals: Start with liquid foods such as gelatin or soup. Progress to regular foods as tolerated. Avoid greasy, spicy, heavy foods. If nausea and/or vomiting occur, drink only clear liquids until the nausea and/or vomiting subsides. Call your physician if vomiting continues.  Special Instructions/Symptoms: Your throat may feel dry or sore from the anesthesia or the breathing tube placed in your throat during surgery. If this causes discomfort, gargle with warm salt water. The discomfort should disappear within 24 hours.  TD@

## 2012-01-08 NOTE — Anesthesia Preprocedure Evaluation (Addendum)
Anesthesia Evaluation  Patient identified by MRN, date of birth, ID band Patient awake    Reviewed: Allergy & Precautions, H&P , NPO status , Patient's Chart, lab work & pertinent test results  Airway Mallampati: II TM Distance: >3 FB Neck ROM: Full    Dental No notable dental hx. (+) Dental Advisory Given   Pulmonary neg pulmonary ROS,  breath sounds clear to auscultation  Pulmonary exam normal       Cardiovascular hypertension, Pt. on medications negative cardio ROS  Rhythm:Regular Rate:Normal     Neuro/Psych negative neurological ROS  negative psych ROS   GI/Hepatic negative GI ROS, Neg liver ROS,   Endo/Other  negative endocrine ROS  Renal/GU negative Renal ROS  negative genitourinary   Musculoskeletal negative musculoskeletal ROS (+)   Abdominal   Peds negative pediatric ROS (+)  Hematology negative hematology ROS (+)   Anesthesia Other Findings   Reproductive/Obstetrics negative OB ROS                         Anesthesia Physical Anesthesia Plan  ASA: II  Anesthesia Plan: General   Post-op Pain Management:    Induction: Intravenous  Airway Management Planned: LMA  Additional Equipment:   Intra-op Plan:   Post-operative Plan: Extubation in OR  Informed Consent: I have reviewed the patients History and Physical, chart, labs and discussed the procedure including the risks, benefits and alternatives for the proposed anesthesia with the patient or authorized representative who has indicated his/her understanding and acceptance.   Dental advisory given  Plan Discussed with: CRNA  Anesthesia Plan Comments:         Anesthesia Quick Evaluation

## 2012-01-08 NOTE — Anesthesia Procedure Notes (Signed)
Procedure Name: LMA Insertion Date/Time: 01/08/2012 1:11 PM Performed by: Jessica Priest Pre-anesthesia Checklist: Patient identified, Emergency Drugs available, Suction available and Patient being monitored Patient Re-evaluated:Patient Re-evaluated prior to inductionOxygen Delivery Method: Circle System Utilized Preoxygenation: Pre-oxygenation with 100% oxygen Intubation Type: IV induction Ventilation: Mask ventilation without difficulty LMA: LMA inserted LMA Size: 4.0 Number of attempts: 1 Airway Equipment and Method: bite block Placement Confirmation: positive ETCO2 Tube secured with: Tape Dental Injury: Teeth and Oropharynx as per pre-operative assessment

## 2012-01-08 NOTE — Transfer of Care (Signed)
Immediate Anesthesia Transfer of Care Note  Patient: Sara Benson  Procedure(s) Performed: Procedure(s) (LRB): CO2 LASER APPLICATION (N/A)  Patient Location: PACU  Anesthesia Type: General  Level of Consciousness: awake, sedated, patient cooperative and responds to stimulation  Airway & Oxygen Therapy: Patient Spontanous Breathing and Patient connected to face mask oxygen  Post-op Assessment: Report given to PACU RN, Post -op Vital signs reviewed and stable and Patient moving all extremities  Post vital signs: Reviewed and stable  Complications: No apparent anesthesia complications

## 2012-01-12 ENCOUNTER — Telehealth: Payer: Self-pay | Admitting: *Deleted

## 2012-01-12 NOTE — Telephone Encounter (Signed)
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 Pt calling c/o nausea, no vomiting, pt would like rx to help with this. Please advise

## 2012-01-12 NOTE — Telephone Encounter (Signed)
Reglan 10 mg one po q 4-6 hours prn #30

## 2012-01-13 ENCOUNTER — Ambulatory Visit (INDEPENDENT_AMBULATORY_CARE_PROVIDER_SITE_OTHER): Payer: BC Managed Care – PPO | Admitting: Gynecology

## 2012-01-13 ENCOUNTER — Encounter: Payer: Self-pay | Admitting: Gynecology

## 2012-01-13 VITALS — BP 132/88

## 2012-01-13 DIAGNOSIS — Z9889 Other specified postprocedural states: Secondary | ICD-10-CM

## 2012-01-13 MED ORDER — METOCLOPRAMIDE HCL 10 MG PO TABS: ORAL_TABLET | ORAL | Status: DC

## 2012-01-13 NOTE — Telephone Encounter (Signed)
Spoke with pt regarding the below note, and she is c/o bleeding since her CO2laser ablation, pt said bleeding was light after surgery, but now has increased to a flow like a normal cycle. C/o cramping, not heavy bleeding, pt said she would notice more bleeding while moving around. Please advise

## 2012-01-13 NOTE — Telephone Encounter (Signed)
rx sent

## 2012-01-13 NOTE — Telephone Encounter (Signed)
Pt informed with the below note. 

## 2012-01-13 NOTE — Telephone Encounter (Signed)
Please call patient and tell her that I will see her this afternoon

## 2012-01-13 NOTE — Progress Notes (Signed)
Patient presented to the office today complaining of vaginal bleeding after her CO2 laser ablation of her cervix on October 10 for CIN-1 and CIN-2.  Exam: Bartholin urethra Skene glands: Within normal limits Vagina: Some blood was noted in the vaginal vault Cervix: Cervical bed from CO2 laser ablation had several small areas that were oozing which were contained with silver nitrate.  Patient scheduled to return back to the office in 2 weeks for final postop visit. She was instructed to finish her Estrace vaginal cream which she will finish at the end this week.

## 2012-01-29 ENCOUNTER — Ambulatory Visit: Payer: BC Managed Care – PPO | Admitting: Gynecology

## 2012-02-03 ENCOUNTER — Ambulatory Visit: Payer: BC Managed Care – PPO | Admitting: Gynecology

## 2012-02-03 NOTE — Progress Notes (Signed)
No show

## 2012-02-19 ENCOUNTER — Ambulatory Visit (INDEPENDENT_AMBULATORY_CARE_PROVIDER_SITE_OTHER): Payer: BC Managed Care – PPO | Admitting: Gynecology

## 2012-02-19 ENCOUNTER — Encounter: Payer: Self-pay | Admitting: Gynecology

## 2012-02-19 VITALS — BP 138/88

## 2012-02-19 DIAGNOSIS — Z9889 Other specified postprocedural states: Secondary | ICD-10-CM

## 2012-02-19 DIAGNOSIS — N871 Moderate cervical dysplasia: Secondary | ICD-10-CM | POA: Insufficient documentation

## 2012-02-19 NOTE — Progress Notes (Signed)
Patient presented to the office for a two-week postop visit. 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 She is doing well asymptomatic. Sexually active but using condoms.  Exam: Bartholin urethra Skene was within normal limits Vagina: No lesions or discharge Cervix: Cervical bed was completely healed  Summary of her past Pap smears and colposcopic directed biopsies as follows:  July 2012 low-grade squamous intraepithelial lesion  August 2012 colposcopic directed biopsy demonstrating CIN-02 May 2011 followup Pap smear low-grade squamous intraepithelial lesion few cells suggestive of higher grade lesion  Colposcopic directed biopsy: (February 2013)  Diagnosis  1. Endocervix, curettage  - BENIGN ENDOCERVICAL GLANDS AND ABUNDANT MUCIN.  2. Cervix, biopsy, 10 o'clock  - HIGH GRADE SQUAMOUS INTRAEPITHELIAL LESION, CIN-III (SEVERE DYSPLASIA/CIS).  3. Cervix, biopsy, 4 o'clock  - HIGH GRADE SQUAMOUS INTRAEPITHELIAL LESION, CIN-II (MODERATE DYSPLASIA).  4. Cervix, biopsy, 10 o'clock  - HIGH GRADE SQUAMOUS INTRAEPITHELIAL LESION, CIN II-III (MODERATE/SEVERE  DYSPLASIA/CIS). And  Patient was seen in the office again on September 13 and underwent a repeat colposcopic evaluation since she had been poor compliant and not return to the office after the last biopsy to discuss treatment options. She was treated earlier in the year for [facility] and Chlamydia culture in her followup test of cure cultures were negative.  See pictures from last colposcopic evaluation September 13 in epic. Pathology report from colposcopic directed biopsies on September 13 as follows:  Diagnosis  1. Endocervix, curettage, C&B  - DETACHED FRAGMENTS OF HIGH GRADE SQUAMOUS INTRAEPITHELIAL LESION, CIN-II  (MODERATE DYSPLASIA).  - BENIGN ENDOCERVICAL MUCOSA.  2. Cervix, biopsy, 2 o'clock  - HIGH GRADE SQUAMOUS INTRAEPITHELIAL LESION, CIN-II (MODERATE DYSPLASIA) SEE  COMMENT.  3. Cervix, biopsy, 4 o'clock    - LOW GRADE SQUAMOUS INTRAEPITHELIAL LESION, CIN-I (MILD DYSPLASIA).  4. Cervix, biopsy, 11 o'clock  - LOW GRADE SQUAMOUS INTRAEPITHELIAL LESION, CIN-I (MILD DYSPLASIA).   Patient promises good compliance and will return back in 6 months for followup Pap smear.

## 2012-08-20 ENCOUNTER — Ambulatory Visit (INDEPENDENT_AMBULATORY_CARE_PROVIDER_SITE_OTHER): Payer: BC Managed Care – PPO | Admitting: Gynecology

## 2012-08-20 ENCOUNTER — Other Ambulatory Visit (HOSPITAL_COMMUNITY)
Admission: RE | Admit: 2012-08-20 | Discharge: 2012-08-20 | Disposition: A | Discharge status: Home / Self Care | Payer: BC Managed Care – PPO | Source: Ambulatory Visit | Attending: Gynecology | Admitting: Gynecology

## 2012-08-20 VITALS — BP 114/74

## 2012-08-20 DIAGNOSIS — Z1151 Encounter for screening for human papillomavirus (HPV): Secondary | ICD-10-CM | POA: Insufficient documentation

## 2012-08-20 DIAGNOSIS — Z01419 Encounter for gynecological examination (general) (routine) without abnormal findings: Secondary | ICD-10-CM | POA: Insufficient documentation

## 2012-08-20 DIAGNOSIS — N871 Moderate cervical dysplasia: Secondary | ICD-10-CM

## 2012-08-20 NOTE — Progress Notes (Signed)
22 year old patient with history of dysplasia presented to the office today for followup Pap smear. Past dysplasia history as follows:  Summary of her past Pap smears and colposcopic directed biopsies as follows:   July 2012 low-grade squamous intraepithelial lesion  August 2012 colposcopic directed biopsy demonstrating CIN-02 May 2011 followup Pap smear low-grade squamous intraepithelial lesion few cells suggestive of higher grade lesion   Colposcopic directed biopsy: (February 2013)  Diagnosis  1. Endocervix, curettage  - BENIGN ENDOCERVICAL GLANDS AND ABUNDANT MUCIN.  2. Cervix, biopsy, 10 o'clock  - HIGH GRADE SQUAMOUS INTRAEPITHELIAL LESION, CIN-III (SEVERE DYSPLASIA/CIS).  3. Cervix, biopsy, 4 o'clock  - HIGH GRADE SQUAMOUS INTRAEPITHELIAL LESION, CIN-II (MODERATE DYSPLASIA).  4. Cervix, biopsy, 10 o'clock  - HIGH GRADE SQUAMOUS INTRAEPITHELIAL LESION, CIN II-III (MODERATE/SEVERE  DYSPLASIA/CIS).  Patient was seen in the office again on September 13 and underwent a repeat colposcopic evaluation since she had been poor compliant and not return to the office after the last biopsy to discuss treatment options. She was treated earlier in the year for [facility] and Chlamydia culture in her followup test of cure cultures were negative.   See pictures from last colposcopic evaluation September 13 in epic. Pathology report from colposcopic directed biopsies on September 13 as follows:  Diagnosis  1. Endocervix, curettage, C&B  - DETACHED FRAGMENTS OF HIGH GRADE SQUAMOUS INTRAEPITHELIAL LESION, CIN-II  (MODERATE DYSPLASIA).  - BENIGN ENDOCERVICAL MUCOSA.  2. Cervix, biopsy, 2 o'clock  - HIGH GRADE SQUAMOUS INTRAEPITHELIAL LESION, CIN-II (MODERATE DYSPLASIA) SEE  COMMENT.  3. Cervix, biopsy, 4 o'clock  - LOW GRADE SQUAMOUS INTRAEPITHELIAL LESION, CIN-I (MILD DYSPLASIA).  4. Cervix, biopsy, 11 o'clock  - LOW GRADE SQUAMOUS INTRAEPITHELIAL LESION, CIN-I (MILD DYSPLASIA).   Patient  underwent CO2 laser ablation for CIN-1 CIN-2 on 01/08/2012  Patient is using condoms for contraception. Pap smear was done today. A vigorous Cytobrush was used in the endocervical canal. Patient will return back in 6 months for her annual exam.

## 2012-08-31 ENCOUNTER — Other Ambulatory Visit: Payer: Self-pay | Admitting: Gynecology

## 2012-09-01 ENCOUNTER — Other Ambulatory Visit: Payer: Self-pay | Admitting: Gynecology

## 2012-09-01 MED ORDER — TINIDAZOLE 500 MG PO TABS: 2.0000 g | ORAL_TABLET | Freq: Once | ORAL | Status: DC

## 2012-09-14 ENCOUNTER — Encounter: Payer: Self-pay | Admitting: Gynecology

## 2012-09-14 ENCOUNTER — Ambulatory Visit (INDEPENDENT_AMBULATORY_CARE_PROVIDER_SITE_OTHER): Payer: BC Managed Care – PPO | Admitting: Gynecology

## 2012-09-14 VITALS — BP 132/86

## 2012-09-14 DIAGNOSIS — N760 Acute vaginitis: Secondary | ICD-10-CM

## 2012-09-14 DIAGNOSIS — A749 Chlamydial infection, unspecified: Secondary | ICD-10-CM

## 2012-09-14 DIAGNOSIS — Z113 Encounter for screening for infections with a predominantly sexual mode of transmission: Secondary | ICD-10-CM

## 2012-09-14 DIAGNOSIS — Z87410 Personal history of cervical dysplasia: Secondary | ICD-10-CM

## 2012-09-14 DIAGNOSIS — Z8619 Personal history of other infectious and parasitic diseases: Secondary | ICD-10-CM

## 2012-09-14 LAB — WET PREP FOR TRICH, YEAST, CLUE
Clue Cells Wet Prep HPF POC: NONE SEEN
Trich, Wet Prep: NONE SEEN

## 2012-09-14 MED ORDER — FLUCONAZOLE 100 MG PO TABS: ORAL_TABLET | ORAL | Status: DC

## 2012-09-14 NOTE — Progress Notes (Addendum)
22 year old who was seen in the office on May 23 for followup Pap smear as result of her dysplasia history was found on Pap smear to have trichomoniasis. She was treated with 10 max and was asked to return to the office today for test of cure and to run additional tests.patient has past history of GC and Chlamydia infection. Patient states that she has had intercourse with her partner and he has not been treated yet. Her dysplasia history is as follows:   July 2012 low-grade squamous intraepithelial lesion  August 2012 colposcopic directed biopsy demonstrating CIN-02 May 2011 followup Pap smear low-grade squamous intraepithelial lesion few cells suggestive of higher grade lesion  Colposcopic directed biopsy: (February 2013)  Diagnosis  1. Endocervix, curettage  - BENIGN ENDOCERVICAL GLANDS AND ABUNDANT MUCIN.  2. Cervix, biopsy, 10 o'clock  - HIGH GRADE SQUAMOUS INTRAEPITHELIAL LESION, CIN-III (SEVERE DYSPLASIA/CIS).  3. Cervix, biopsy, 4 o'clock  - HIGH GRADE SQUAMOUS INTRAEPITHELIAL LESION, CIN-II (MODERATE DYSPLASIA).  4. Cervix, biopsy, 10 o'clock  - HIGH GRADE SQUAMOUS INTRAEPITHELIAL LESION, CIN II-III (MODERATE/SEVERE  DYSPLASIA/CIS).  Patient was seen in the office again on September 13 and underwent a repeat colposcopic evaluation since she had been poor compliant and not return to the office after the last biopsy to discuss treatment options. She was treated earlier in the year for [facility] and Chlamydia culture in her followup test of cure cultures were negative.  See pictures from last colposcopic evaluation September 13 in epic. Pathology report from colposcopic directed biopsies on September 13 as follows:  Diagnosis  1. Endocervix, curettage, C&B  - DETACHED FRAGMENTS OF HIGH GRADE SQUAMOUS INTRAEPITHELIAL LESION, CIN-II  (MODERATE DYSPLASIA).  - BENIGN ENDOCERVICAL MUCOSA.  2. Cervix, biopsy, 2 o'clock  - HIGH GRADE SQUAMOUS INTRAEPITHELIAL LESION, CIN-II (MODERATE DYSPLASIA)  SEE  COMMENT.  3. Cervix, biopsy, 4 o'clock  - LOW GRADE SQUAMOUS INTRAEPITHELIAL LESION, CIN-I (MILD DYSPLASIA).  4. Cervix, biopsy, 11 o'clock  - LOW GRADE SQUAMOUS INTRAEPITHELIAL LESION, CIN-I (MILD DYSPLASIA).  Patient underwent CO2 laser ablation for CIN-1 CIN-2 on 01/08/2012    Her Pap smear from 08/20/2012 had demonstrated the following:  LOW GRADE SQUAMOUS INTRAEPITHELIAL LESION: CIN-1/ HPV (LSIL). ORGANISM(S) TRICHOMONAS VAGINALIS PRESENT.High-risk HPV was not detected  Wet prep along with GC and chlamydia culture was obtained today.  Wet prep demonstrated Monilia assistant she will be treated with Diflucan 100 mg one by mouth today.  We will continue 2 monitor  patient's mild dysplasia. She is scheduled to return back in 6 months for her annual exam. She will have an HIV, RPR, hepatitis B and C. Blood tests drawn today to complete STD screening. She was advised not to have intercourse with her partner until he gets treated.

## 2012-09-14 NOTE — Patient Instructions (Addendum)
Candidal Vulvovaginitis  Candidal vulvovaginitis is an infection of the vagina and vulva. The vulva is the skin around the opening of the vagina. This may cause itching and discomfort in and around the vagina.   HOME CARE  · Only take medicine as told by your doctor.  · Do not have sex (intercourse) until the infection is healed or as told by your doctor.  · Practice safe sex.  · Tell your sex partner about your infection.  · Do not douche or use tampons.  · Wear cotton underwear. Do not wear tight pants or panty hose.  · Eat yogurt. This may help treat and prevent yeast infections.  GET HELP RIGHT AWAY IF:   · You have a fever.  · Your problems get worse during treatment or do not get better in 3 days.  · You have discomfort, irritation, or itching in your vagina or vulva area.  · You have pain after sex.  · You start to get belly (abdominal) pain.  MAKE SURE YOU:  · Understand these instructions.  · Will watch your condition.  · Will get help right away if you are not doing well or get worse.  Document Released: 06/13/2008 Document Revised: 06/09/2011 Document Reviewed: 06/13/2008  [employer]® Patient Information ©2014 [employer].

## 2012-09-15 LAB — HEPATITIS B SURFACE ANTIGEN: Hepatitis B Surface Ag: NEGATIVE

## 2012-09-16 ENCOUNTER — Telehealth: Payer: Self-pay | Admitting: *Deleted

## 2012-09-16 LAB — GC/CHLAMYDIA PROBE AMP
CT Probe RNA: POSITIVE — AB
GC Probe RNA: NEGATIVE

## 2012-09-16 MED ORDER — DOXYCYCLINE HYCLATE 100 MG PO CAPS: 100.0000 mg | ORAL_CAPSULE | Freq: Two times a day (BID) | ORAL | Status: DC

## 2012-09-16 NOTE — Telephone Encounter (Signed)
Message copied by Aura Camps on Thu Sep 16, 2012 10:57 AM ------      Message from: Ok Edwards      Created: Thu Sep 16, 2012 10:20 AM       Please inform patient'sthat her culture came back positive for Chlamydia. Please call in a prescription Vibramycin 100 mg twice a day for 7 days. She was reminded once again not to have sexual activity with her partner that has given her STDs several times in the past until he has been treated completely. She needs to return back to the office in 2 weeks for test of cure.                        ------

## 2012-09-16 NOTE — Telephone Encounter (Signed)
Pt informed with the below note. rx sent, pt will call back for 2 week test of cure.

## 2012-11-08 ENCOUNTER — Encounter (HOSPITAL_BASED_OUTPATIENT_CLINIC_OR_DEPARTMENT_OTHER): Payer: Self-pay | Admitting: *Deleted

## 2012-11-08 ENCOUNTER — Emergency Department (HOSPITAL_BASED_OUTPATIENT_CLINIC_OR_DEPARTMENT_OTHER)
Admission: EM | Admit: 2012-11-08 | Discharge: 2012-11-08 | Disposition: A | Discharge status: Home / Self Care | Payer: BC Managed Care – PPO | Attending: Emergency Medicine | Admitting: Emergency Medicine

## 2012-11-08 DIAGNOSIS — I10 Essential (primary) hypertension: Secondary | ICD-10-CM | POA: Insufficient documentation

## 2012-11-08 DIAGNOSIS — Z79899 Other long term (current) drug therapy: Secondary | ICD-10-CM | POA: Insufficient documentation

## 2012-11-08 DIAGNOSIS — R079 Chest pain, unspecified: Secondary | ICD-10-CM

## 2012-11-08 DIAGNOSIS — R0602 Shortness of breath: Secondary | ICD-10-CM | POA: Insufficient documentation

## 2012-11-08 DIAGNOSIS — R0789 Other chest pain: Secondary | ICD-10-CM | POA: Insufficient documentation

## 2012-11-08 DIAGNOSIS — Z8619 Personal history of other infectious and parasitic diseases: Secondary | ICD-10-CM | POA: Insufficient documentation

## 2012-11-08 LAB — COMPREHENSIVE METABOLIC PANEL
ALT: 31 U/L (ref 0–35)
AST: 21 U/L (ref 0–37)
Calcium: 10.1 mg/dL (ref 8.4–10.5)
GFR calc Af Amer: >90 mL/min (ref >90)
Sodium: 139 mEq/L (ref 135–145)
Total Protein: 8.3 g/dL (ref 6.0–8.3)

## 2012-11-08 LAB — CBC
MCH: 29.5 pg (ref 26.0–34.0)
MCHC: 36.1 g/dL — ABNORMAL HIGH (ref 30.0–36.0)
Platelets: 325 10*3/uL (ref 150–400)
RDW: 12.4 % (ref 11.5–15.5)

## 2012-11-08 MED ORDER — ASPIRIN 81 MG PO CHEW
324.0000 mg | CHEWABLE_TABLET | Freq: Once | ORAL | Status: AC
  Administered 2012-11-08: 324 mg via ORAL
  Filled 2012-11-08: qty 4

## 2012-11-08 NOTE — ED Provider Notes (Signed)
I saw and evaluated the patient, reviewed the resident's note and I agree with the findings and plan.   .Face to face Exam:  General:  Awake HEENT:  Atraumatic Resp:  Normal effort Abd:  Nondistended Neuro:No focal weakness Lymph: No adenopathy   Nelia Shi, MD 11/08/12 [phone]

## 2012-11-08 NOTE — ED Provider Notes (Signed)
CSN: [id number]     Arrival date & time 11/08/12  0830 History     First MD Initiated Contact with Patient 11/08/12 [phone]     Chief Complaint  Patient presents with  . Chest Pain   (Consider location/radiation/quality/duration/timing/severity/associated sxs/prior Treatment) HPI 22 year old female with history of HTN presents with chest pain.  Patient reports that she has had chest pain for the past 3 weeks.  She reports that her chest pain occurs daily.  Pain located in the mid sternum and described as a dull ache with some associated pressure.  No aggravating factors.  Not associated with exertion.  No relieved by rest.  Patient reports that she has tried Advil and Excedrin with some relief with the Excedrin.  No associated nausea, vomiting, diaphoresis.  She does endorse some associated SOB.  Patient is currently pain free.  Past Medical History  Diagnosis Date  . Hypertension   . Normal spontaneous vaginal delivery   . 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 Tonsillectomy and adenoidectomy  AGE 73 (2010)   Family History  Problem Relation Age of Onset  . Hypertension Mother    History  Substance Use Topics  . Smoking status: Never Smoker   . Smokeless tobacco: Never Used  . Alcohol Use: No   OB History   Grav Para Term Preterm Abortions TAB SAB Ect Mult Living   1 1        1      Review of Systems  Constitutional: Negative for fever and chills.  Respiratory: Positive for shortness of breath. Negative for chest tightness.   Cardiovascular: Positive for chest pain.  Gastrointestinal: Negative for nausea and vomiting.   Allergies  Review of patient's allergies indicates no known allergies.  Home Medications   Current Outpatient Rx  Name  Route  Sig  Dispense  Refill  . acetaminophen (TYLENOL) 325 MG tablet   Oral   Take 650 mg by mouth every 6 (six) hours as needed.         . ATENOLOL PO   Oral   Take  25 mg by mouth every morning.          Marland Kitchen doxycycline (VIBRAMYCIN) 100 MG capsule   Oral   Take 1 capsule (100 mg total) by mouth 2 (two) times daily.   14 capsule   0   . Etonogestrel (IMPLANON Niotaze)   Subcutaneous   Inject into the skin. Inserted left arm 05/29/08         . fluconazole (DIFLUCAN) 100 MG tablet      Take one tablet today   1 tablet   02   . tinidazole (TINDAMAX) 500 MG tablet   Oral   Take 4 tablets (2,000 mg total) by mouth once.   4 tablet   0     Take with food.    BP 142/76  Temp(Src) 99 F (37.2 C) (Oral)  Resp 20  Ht 5\' 8"  (1.727 m)  Wt 249 lb (112.946 kg)  BMI 37.87 kg/m2  SpO2 100% Physical Exam  Constitutional: She is oriented to person, place, and time. She appears well-developed and well-nourished. No distress.  HENT:  Head: Normocephalic and atraumatic.  Eyes: No scleral icterus.  Cardiovascular: Normal rate, regular rhythm and normal heart sounds.  Exam reveals no gallop and no friction rub.   No murmur heard. Pulmonary/Chest: Effort normal and breath sounds normal. No respiratory distress. She has  no wheezes. She has no rales.  Abdominal: Soft. She exhibits no distension. There is no tenderness.  Musculoskeletal: She exhibits no edema.  Neurological: She is alert and oriented to person, place, and time.  Skin: Skin is warm and dry.   ED Course   Procedures (including critical care time) Date: 11/08/2012 Rate: 69 Rhythm: normal sinus rhythm QRS Axis: normal Intervals: normal ST/T Wave abnormalities: normal Conduction Disutrbances:none Narrative Interpretation: Normal sinus rhythm with no ST or T wave changes suggestive of ischemia Old EKG Reviewed: none available  Labs Reviewed  CBC - Abnormal; Notable for the following:    MCHC 36.1 (*)    All other components within normal limits  COMPREHENSIVE METABOLIC PANEL - Abnormal; Notable for the following:    Glucose, Bld 101 (*)    Alkaline Phosphatase 124 (*)    All other  components within normal limits  TROPONIN I  D-DIMER, QUANTITATIVE   No results found. 1. Chest pain     MDM  22 year old female with history of HTN presents with chest pain. - Given age and presentation, highly doubt ACS. - Will obtain CBC, CMP, and Troponin.  ASA 324 x 1.  - Patient currently pain free; EKG normal - Will await labs.  0940 - CBC, CMP unremarkable.  Troponin and D-Dimer negative.  Patient pain free and stable for discharge.  Will discharge home with instructions to follow up closely with PCP.     Tommie Sams, DO 11/08/12 [phone]

## 2012-11-08 NOTE — ED Notes (Signed)
Patient states she has a three week history of central chest pain, pressure and tightness which is associated with shortness of breath.  States the pain is constant, but did not have any last week while on vacation.  States the pain goes away while sleeping.  History of HTN.

## 2013-01-06 ENCOUNTER — Encounter: Payer: BC Managed Care – PPO | Admitting: Gynecology

## 2013-02-07 ENCOUNTER — Ambulatory Visit (INDEPENDENT_AMBULATORY_CARE_PROVIDER_SITE_OTHER): Payer: BC Managed Care – PPO | Admitting: Gynecology

## 2013-02-07 ENCOUNTER — Other Ambulatory Visit (HOSPITAL_COMMUNITY)
Admission: RE | Admit: 2013-02-07 | Discharge: 2013-02-07 | Disposition: A | Discharge status: Home / Self Care | Payer: BC Managed Care – PPO | Source: Ambulatory Visit | Attending: Gynecology | Admitting: Gynecology

## 2013-02-07 ENCOUNTER — Encounter: Payer: Self-pay | Admitting: Gynecology

## 2013-02-07 VITALS — BP 142/90 | Ht 66.5 in | Wt 246.8 lb

## 2013-02-07 DIAGNOSIS — Z01419 Encounter for gynecological examination (general) (routine) without abnormal findings: Secondary | ICD-10-CM

## 2013-02-07 DIAGNOSIS — Z113 Encounter for screening for infections with a predominantly sexual mode of transmission: Secondary | ICD-10-CM

## 2013-02-07 DIAGNOSIS — Z87410 Personal history of cervical dysplasia: Secondary | ICD-10-CM

## 2013-02-07 DIAGNOSIS — J019 Acute sinusitis, unspecified: Secondary | ICD-10-CM

## 2013-02-07 DIAGNOSIS — N62 Hypertrophy of breast: Secondary | ICD-10-CM

## 2013-02-07 LAB — WET PREP FOR TRICH, YEAST, CLUE: Trich, Wet Prep: NONE SEEN

## 2013-02-07 MED ORDER — CEFUROXIME AXETIL 250 MG PO TABS: 250.0000 mg | ORAL_TABLET | Freq: Two times a day (BID) | ORAL | Status: DC

## 2013-02-07 MED ORDER — ATENOLOL 25 MG PO TABS: 25.0000 mg | ORAL_TABLET | Freq: Every day | ORAL | Status: DC

## 2013-02-07 MED ORDER — FLUCONAZOLE 150 MG PO TABS: 150.0000 mg | ORAL_TABLET | Freq: Once | ORAL | Status: DC

## 2013-02-07 NOTE — Progress Notes (Signed)
Sara Benson [birth year] [id number]   History:    22 y.o.  for annual gyn exam with several complaints. Patient states that her large breasts are causing her to have a lot of back discomfort. Also patient complaining of postnasal drip and frontal headache. Patient with past history of cervical dysplasia as follows:  July 2012 low-grade squamous intraepithelial lesion  August 2012 colposcopic directed biopsy demonstrating CIN-02 May 2011 followup Pap smear low-grade squamous intraepithelial lesion few cells suggestive of higher grade lesion  Colposcopic directed biopsy: (February 2013)  Diagnosis  1. Endocervix, curettage  - BENIGN ENDOCERVICAL GLANDS AND ABUNDANT MUCIN.  2. Cervix, biopsy, 10 o'clock  - HIGH GRADE SQUAMOUS INTRAEPITHELIAL LESION, CIN-III (SEVERE DYSPLASIA/CIS).  3. Cervix, biopsy, 4 o'clock  - HIGH GRADE SQUAMOUS INTRAEPITHELIAL LESION, CIN-II (MODERATE DYSPLASIA).  4. Cervix, biopsy, 10 o'clock  - HIGH GRADE SQUAMOUS INTRAEPITHELIAL LESION, CIN II-III (MODERATE/SEVERE  DYSPLASIA/CIS).  Patient was seen in the office again on September 13 and underwent a repeat colposcopic evaluation since she had been poor compliant and not return to the office after the last biopsy to discuss treatment options. She was treated earlier in the year for [facility] and Chlamydia culture in her followup test of cure cultures were negative.  See pictures from last colposcopic evaluation September 13 in epic. Pathology report from colposcopic directed biopsies on September 13 as follows:  Diagnosis  1. Endocervix, curettage, C&B  - DETACHED FRAGMENTS OF HIGH GRADE SQUAMOUS INTRAEPITHELIAL LESION, CIN-II  (MODERATE DYSPLASIA).  - BENIGN ENDOCERVICAL MUCOSA.  2. Cervix, biopsy, 2 o'clock  - HIGH GRADE SQUAMOUS INTRAEPITHELIAL LESION, CIN-II (MODERATE DYSPLASIA) SEE  COMMENT.  3. Cervix, biopsy, 4 o'clock  - LOW GRADE SQUAMOUS INTRAEPITHELIAL LESION, CIN-I (MILD DYSPLASIA).  4. Cervix, biopsy,  11 o'clock  - LOW GRADE SQUAMOUS INTRAEPITHELIAL LESION, CIN-I (MILD DYSPLASIA).  Patient underwent CO2 laser ablation for CIN-1 CIN-2 on 01/08/2012  Her Pap smear from 08/20/2012 had demonstrated the following:  LOW GRADE SQUAMOUS INTRAEPITHELIAL LESION: CIN-1/ HPV (LSIL).  ORGANISM(S)  TRICHOMONAS VAGINALIS PRESENT.High-risk HPV was not detected   Patient has been treated for GC and chlamydia. There is still question whether her partner has been treated or not. Patient has continued to have sex with this partner although she states that she does use condoms. She does have a Nexplanon that was placed on her left arm in 2013. She is having no menstrual cycles.  Patient has received in the past the HPV vaccine.   Past medical history,surgical history, family history and social history were all reviewed and documented in the EPIC chart.  Gynecologic History No LMP recorded. Patient has had an implant. Contraception: Nexplanon Last Pap: see above. Results were: see above Last mammogram: none indicated. Results were: abnormal  Obstetric History OB History  Gravida Para Term Preterm AB SAB TAB Ectopic Multiple Living  1 1        1     # Outcome Date GA Lbr Len/2nd Weight Sex Delivery Anes PTL Lv  1 PAR                ROS: A ROS was performed and pertinent positives and negatives are included in the history.  GENERAL: No fevers or chills. HEENT: sinus congestionNECK: No pain or stiffness. CARDIOVASCULAR: No chest pain or pressure. No palpitations. PULMONARY: No shortness of breath, cough or wheeze. GASTROINTESTINAL: No abdominal pain, nausea, vomiting or diarrhea, melena or bright red blood per rectum. GENITOURINARY: No urinary frequency, urgency, hesitancy or dysuria. MUSCULOSKELETAL: No  joint or muscle pain, no back pain, no recent trauma. DERMATOLOGIC: No rash, no itching, no lesions. ENDOCRINE: No polyuria, polydipsia, no heat or cold intolerance. No recent change in weight.  HEMATOLOGICAL: No anemia or easy bruising or bleeding. NEUROLOGIC: No headache, seizures, numbness, tingling or weakness. PSYCHIATRIC: No depression, no loss of interest in normal activity or change in sleep pattern.     Exam: chaperone present  BP 142/90  Ht 5' 6.5" (1.689 m)  Wt 246 lb 12.8 oz (111.948 kg)  BMI 39.24 kg/m2  Body mass index is 39.24 kg/(m^2).  General appearance : Well developed well nourished female. No acute distress HEENT:tender frontal and maxillary sinuses Lungs: Clear to auscultation, no rhonchi or wheezes, or rib retractions  Heart: Regular rate and rhythm, no murmurs or gallops Breast:Examined in sitting and supine position were symmetrical in appearance, no palpable masses or tenderness,  no skin retraction, no nipple inversion, no nipple discharge, no skin discoloration, no axillary or supraclavicular lymphadenopathy Abdomen: no palpable masses or tenderness, no rebound or guarding Extremities: no edema or skin discoloration or tenderness  Pelvic:  Bartholin, Urethra, Skene Glands: Within normal limits             Vagina: No gross lesions or discharge  Cervix: No gross lesions or discharge  Uterus  None indicated, normal size, shape and consistency, non-tender and mobile  Adnexa  Without masses or tenderness  Anus and perineum  normal   Rectovaginal  normal sphincter tone without palpated masses or tenderness             Hemoccult none indicated     Assessment/Plan:  22 y.o. female for annual exam With acute sinusitis will be placed on Cefotan 500 mg twice a day for 7 days #14. Patient with long-standing history of cervical dysplasia at a young age and past history of STD. See note above. Wet prep today and no masses. GC and chlamydia culture along with HIV, RPR, hepatitis B and C. Results pending at time of this dictation. Patient's primary physician will be drawn her blood work next week so no blood work was drawn today. Prescription refill for her  atenolol 25 mg which she is taking for hypertension was provided for 30 days until she sees her primary. We'll have the patient return back to the office in 6 months for Pap smear and colposcopy. Once again stressed the importance of her partner seeking treatment before she has sexual activity with him. Patient will be referred to the plastic surgeon as a result of her pendulous breasts which are causing her back discomfort and headaches.  Note: This dictation was prepared with  Dragon/digital dictation along withSmart phrase technology. Any transcriptional errors that result from this process are unintentional.   Ok Edwards MD, 12:47 PM 02/07/2013

## 2013-02-07 NOTE — Patient Instructions (Signed)

## 2013-02-08 LAB — URINALYSIS W MICROSCOPIC + REFLEX CULTURE
Glucose, UA: NEGATIVE mg/dL
Leukocytes, UA: NEGATIVE
Protein, ur: 30 mg/dL — AB
pH: 6 (ref 5.0–8.0)

## 2013-02-08 LAB — GC/CHLAMYDIA PROBE AMP
CT Probe RNA: POSITIVE — AB
GC Probe RNA: NEGATIVE

## 2013-02-09 ENCOUNTER — Other Ambulatory Visit: Payer: Self-pay | Admitting: Gynecology

## 2013-02-09 MED ORDER — AZITHROMYCIN 250 MG PO TABS: 250.0000 mg | ORAL_TABLET | Freq: Once | ORAL | Status: DC

## 2013-02-25 ENCOUNTER — Encounter (HOSPITAL_BASED_OUTPATIENT_CLINIC_OR_DEPARTMENT_OTHER): Payer: Self-pay | Admitting: Emergency Medicine

## 2013-02-25 ENCOUNTER — Emergency Department (HOSPITAL_BASED_OUTPATIENT_CLINIC_OR_DEPARTMENT_OTHER)
Admission: EM | Admit: 2013-02-25 | Discharge: 2013-02-25 | Disposition: A | Discharge status: Home / Self Care | Payer: BC Managed Care – PPO | Attending: Emergency Medicine | Admitting: Emergency Medicine

## 2013-02-25 DIAGNOSIS — R51 Headache: Secondary | ICD-10-CM | POA: Insufficient documentation

## 2013-02-25 DIAGNOSIS — R519 Headache, unspecified: Secondary | ICD-10-CM

## 2013-02-25 DIAGNOSIS — I10 Essential (primary) hypertension: Secondary | ICD-10-CM | POA: Insufficient documentation

## 2013-02-25 DIAGNOSIS — Z79899 Other long term (current) drug therapy: Secondary | ICD-10-CM | POA: Insufficient documentation

## 2013-02-25 DIAGNOSIS — Z792 Long term (current) use of antibiotics: Secondary | ICD-10-CM | POA: Insufficient documentation

## 2013-02-25 DIAGNOSIS — B659 Schistosomiasis, unspecified: Secondary | ICD-10-CM | POA: Insufficient documentation

## 2013-02-25 DIAGNOSIS — Z8742 Personal history of other diseases of the female genital tract: Secondary | ICD-10-CM | POA: Insufficient documentation

## 2013-02-25 MED ORDER — DEXAMETHASONE SODIUM PHOSPHATE 10 MG/ML IJ SOLN
10.0000 mg | Freq: Once | INTRAMUSCULAR | Status: AC
  Administered 2013-02-25: 10 mg via INTRAMUSCULAR
  Filled 2013-02-25: qty 1

## 2013-02-25 MED ORDER — ONDANSETRON 8 MG PO TBDP
8.0000 mg | ORAL_TABLET | Freq: Once | ORAL | Status: AC
  Administered 2013-02-25: 8 mg via ORAL
  Filled 2013-02-25: qty 1

## 2013-02-25 MED ORDER — DIPHENHYDRAMINE HCL 25 MG PO CAPS
25.0000 mg | ORAL_CAPSULE | Freq: Once | ORAL | Status: AC
  Administered 2013-02-25: 25 mg via ORAL
  Filled 2013-02-25: qty 1

## 2013-02-25 NOTE — ED Notes (Signed)
HA started last night and "sinus infection" x 1 month

## 2013-02-25 NOTE — ED Provider Notes (Signed)
Medical screening examination/treatment/procedure(s) were performed by non-physician practitioner and as supervising physician I was immediately available for consultation/collaboration.  EKG Interpretation   None         William Mihaela Fajardo, MD 02/25/13 2315 

## 2013-02-25 NOTE — ED Provider Notes (Signed)
CSN: [id number]     Arrival date & time 02/25/13  1532 History   First MD Initiated Contact with Patient 02/25/13 1547     Chief Complaint  Patient presents with  . Headache   (Consider location/radiation/quality/duration/timing/severity/associated sxs/prior Treatment) Patient is a 22 y.o. female presenting with headaches. The history is provided by the patient.  Headache Pain location:  Frontal Quality:  Sharp Radiates to:  Face Severity currently:  7/10 Severity at highest:  8/10 Onset quality:  Gradual Duration:  1 day Timing:  Constant Progression:  Worsening Chronicity:  Recurrent Similar to prior headaches: yes   Worsened by:  Light and sound Ineffective treatments: excedrin. Associated symptoms: photophobia and sinus pressure   Associated symptoms: no abdominal pain, no blurred vision, no cough, no dizziness, no fever, no nausea, no neck pain, no neck stiffness and no vomiting    Sara Benson is a 22 y.o. female who presents to the ED with a frontal headache that started last night. She had a similar headache 3 months ago and went to her doctor. They gave her a shot and it got better. She is not sure what it was. She has a history of high blood pressure and is on medication. She denies nausea or vomiting, fever or chills or other problems.  Past Medical History  Diagnosis Date  . Hypertension   . Normal spontaneous vaginal delivery   . 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 Cervical dysplasia    Past Surgical History  Procedure Laterality Date  . Tonsillectomy and adenoidectomy  AGE 33 (2010)   Family History  Problem Relation Age of Onset  . Hypertension Mother    History  Substance Use Topics  . Smoking status: Never Smoker   . Smokeless tobacco: Never Used  . Alcohol Use: No   OB History   Grav Para Term Preterm Abortions TAB SAB Ect Mult Living   1 1        1      Review of Systems  Constitutional: Negative for fever and chills.  HENT:  Positive for sinus pressure. Negative for dental problem and trouble swallowing.   Eyes: Positive for photophobia. Negative for blurred vision.  Respiratory: Negative for cough and shortness of breath.   Gastrointestinal: Negative for nausea, vomiting and abdominal pain.  Musculoskeletal: Negative for gait problem, neck pain and neck stiffness.  Skin: Negative for rash.  Allergic/Immunologic: Negative for immunocompromised state.  Neurological: Positive for headaches. Negative for dizziness and syncope.  Psychiatric/Behavioral: The patient is not nervous/anxious.     Allergies  Review of patient's allergies indicates no known allergies.  Home Medications   Current Outpatient Rx  Name  Route  Sig  Dispense  Refill  . aspirin-acetaminophen-caffeine (EXCEDRIN MIGRAINE) 250-250-65 MG per tablet   Oral   Take by mouth every 6 (six) hours as needed for headache.         Marland Kitchen acetaminophen (TYLENOL) 325 MG tablet   Oral   Take 650 mg by mouth every 6 (six) hours as needed.         Marland Kitchen atenolol (TENORMIN) 25 MG tablet   Oral   Take 1 tablet (25 mg total) by mouth daily.   30 tablet   1   . azithromycin (ZITHROMAX) 250 MG tablet   Oral   Take 1 tablet (250 mg total) by mouth once.   4 tablet   0   . cefUROXime (CEFTIN) 250 MG tablet   Oral   Take 1  tablet (250 mg total) by mouth 2 (two) times daily.   14 tablet   0   . Etonogestrel (IMPLANON )   Subcutaneous   Inject into the skin. Inserted left arm 05/29/08         . fluconazole (DIFLUCAN) 150 MG tablet   Oral   Take 1 tablet (150 mg total) by mouth once.   1 tablet   0    BP 177/99  Pulse 71  Temp(Src) 98.7 F (37.1 C) (Oral)  Resp 18  Ht 5\' 8"  (1.727 m)  Wt 246 lb (111.585 kg)  BMI 37.41 kg/m2  SpO2 100% Physical Exam  Nursing note and vitals reviewed. Constitutional: She is oriented to person, place, and time. She appears well-developed and well-nourished. No distress.  HENT:  Head: Normocephalic and  atraumatic.    Right Ear: Tympanic membrane normal.  Left Ear: Tympanic membrane normal.  Mouth/Throat: Uvula is midline, oropharynx is clear and moist and mucous membranes are normal.  Headache around left eye and forehead  Eyes: EOM are normal.  Neck: Neck supple.  Pulmonary/Chest: Effort normal.  Abdominal: Soft. There is no tenderness.  Musculoskeletal: Normal range of motion.  Neurological: She is alert and oriented to person, place, and time. She has normal strength and normal reflexes. No cranial nerve deficit or sensory deficit. She displays a negative Romberg sign. Coordination and gait normal.  Stands on one foot without difficulty  Skin: Skin is warm and dry.  Psychiatric: She has a normal mood and affect. Her behavior is normal.   BP 138/85  Pulse 88  Temp(Src) 98.7 F (37.1 C) (Oral)  Resp 16  Ht 5\' 8"  (1.727 m)  Wt 246 lb (111.585 kg)  BMI 37.41 kg/m2  SpO2 99%  ED Course Benadryl 25 mg PO, Decadron 10 mg. IM, Zofran 8 mg PO  Procedures  Patient feeling much better after medication and ready to go home. Taking PO fluids without nausea.   MDM  22 y.o. female with headache that is relieved with medications. Will d/c home and she will follow up with her PCP. Discussed with the patient and all questioned fully answered. She will return if any problems arise. Stable for discharge with normal neuro exam.     Janne Napoleon, NP 02/25/13 [id number]

## 2013-03-09 ENCOUNTER — Ambulatory Visit: Payer: BC Managed Care – PPO | Admitting: Gynecology

## 2013-03-14 ENCOUNTER — Ambulatory Visit: Payer: BC Managed Care – PPO | Admitting: Gynecology

## 2013-04-26 ENCOUNTER — Emergency Department (HOSPITAL_BASED_OUTPATIENT_CLINIC_OR_DEPARTMENT_OTHER)
Admission: EM | Admit: 2013-04-26 | Discharge: 2013-04-27 | Disposition: A | Discharge status: Home / Self Care | Payer: BC Managed Care – PPO | Attending: Emergency Medicine | Admitting: Emergency Medicine

## 2013-04-26 ENCOUNTER — Encounter (HOSPITAL_BASED_OUTPATIENT_CLINIC_OR_DEPARTMENT_OTHER): Payer: Self-pay | Admitting: Emergency Medicine

## 2013-04-26 DIAGNOSIS — Z792 Long term (current) use of antibiotics: Secondary | ICD-10-CM | POA: Insufficient documentation

## 2013-04-26 DIAGNOSIS — Z8742 Personal history of other diseases of the female genital tract: Secondary | ICD-10-CM | POA: Insufficient documentation

## 2013-04-26 DIAGNOSIS — Z8619 Personal history of other infectious and parasitic diseases: Secondary | ICD-10-CM | POA: Insufficient documentation

## 2013-04-26 DIAGNOSIS — Z79899 Other long term (current) drug therapy: Secondary | ICD-10-CM | POA: Insufficient documentation

## 2013-04-26 DIAGNOSIS — J069 Acute upper respiratory infection, unspecified: Secondary | ICD-10-CM | POA: Insufficient documentation

## 2013-04-26 DIAGNOSIS — I10 Essential (primary) hypertension: Secondary | ICD-10-CM | POA: Insufficient documentation

## 2013-04-26 NOTE — ED Notes (Signed)
C/o feeling like throat is swelling and sinus congestion x today

## 2013-04-27 LAB — RAPID STREP SCREEN (MED CTR MEBANE ONLY): STREPTOCOCCUS, GROUP A SCREEN (DIRECT): NEGATIVE

## 2013-04-27 NOTE — ED Notes (Signed)
MD at bedside. 

## 2013-04-27 NOTE — Discharge Instructions (Signed)

## 2013-04-27 NOTE — ED Provider Notes (Signed)
CSN: [id number]     Arrival date & time 04/26/13  2235 History   First MD Initiated Contact with Patient 04/26/13 2349     Chief Complaint  Patient presents with  . URI   (Consider location/radiation/quality/duration/timing/severity/associated sxs/prior Treatment) Patient is a 23 y.o. female presenting with URI. The history is provided by the patient.  URI Presenting symptoms: congestion (today), rhinorrhea and sore throat   Presenting symptoms: no cough, no fatigue and no fever   Congestion:    Location:  Nasal   Interferes with sleep: yes     Interferes with eating/drinking: no   Rhinorrhea:    Quality:  Clear   Severity:  Mild   Duration:  1 day   Timing:  Constant   Progression:  Worsening Severity:  Mild Onset quality:  Gradual Duration:  3 weeks Timing:  Constant Progression:  Worsening Chronicity:  New Relieved by:  Nothing Worsened by:  Nothing tried Associated symptoms: no arthralgias, no headaches, no myalgias, no neck pain, no sinus pain, no swollen glands and no wheezing     Past Medical History  Diagnosis Date  . Hypertension   . Normal spontaneous vaginal delivery   . 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 Cervical dysplasia    Past Surgical History  Procedure Laterality Date  . Tonsillectomy and adenoidectomy  AGE 67 (2010)   Family History  Problem Relation Age of Onset  . Hypertension Mother    History  Substance Use Topics  . Smoking status: Never Smoker   . Smokeless tobacco: Never Used  . Alcohol Use: No   OB History   Grav Para Term Preterm Abortions TAB SAB Ect Mult Living   1 1        1      Review of Systems  Constitutional: Negative for fever and fatigue.  HENT: Positive for congestion (today), rhinorrhea and sore throat.   Respiratory: Negative for cough and wheezing.   Musculoskeletal: Negative for arthralgias, myalgias and neck pain.  Neurological: Negative for headaches.  All other systems reviewed and are  negative.    Allergies  Review of patient's allergies indicates no known allergies.  Home Medications   Current Outpatient Rx  Name  Route  Sig  Dispense  Refill  . acetaminophen (TYLENOL) 325 MG tablet   Oral   Take 650 mg by mouth every 6 (six) hours as needed.         Marland Kitchen aspirin-acetaminophen-caffeine (EXCEDRIN MIGRAINE) 250-250-65 MG per tablet   Oral   Take by mouth every 6 (six) hours as needed for headache.         Marland Kitchen atenolol (TENORMIN) 25 MG tablet   Oral   Take 1 tablet (25 mg total) by mouth daily.   30 tablet   1   . azithromycin (ZITHROMAX) 250 MG tablet   Oral   Take 1 tablet (250 mg total) by mouth once.   4 tablet   0   . cefUROXime (CEFTIN) 250 MG tablet   Oral   Take 1 tablet (250 mg total) by mouth 2 (two) times daily.   14 tablet   0   . Etonogestrel (IMPLANON [place])   Subcutaneous   Inject into the skin. Inserted left arm 05/29/08         . fluconazole (DIFLUCAN) 150 MG tablet   Oral   Take 1 tablet (150 mg total) by mouth once.   1 tablet   0    BP 185/112  Pulse  89  Temp(Src) 99 F (37.2 C) (Oral)  Resp 20  Ht 5\' 8"  (1.727 m)  Wt 240 lb (108.863 kg)  BMI 36.50 kg/m2  SpO2 100% Physical Exam  Nursing note and vitals reviewed. Constitutional: She is oriented to person, place, and time. She appears well-developed and well-nourished. No distress.  HENT:  Head: Normocephalic and atraumatic.  Eyes: EOM are normal. Pupils are equal, round, and reactive to light.  Neck: Normal range of motion. Neck supple.  Cardiovascular: Normal rate and regular rhythm.  Exam reveals no friction rub.   No murmur heard. Pulmonary/Chest: Effort normal and breath sounds normal. No stridor. No respiratory distress. She has no wheezes. She has no rales.  Abdominal: Soft. She exhibits no distension. There is no tenderness. There is no rebound.  Musculoskeletal: Normal range of motion. She exhibits no edema.  Neurological: She is alert and oriented to  person, place, and time. No cranial nerve deficit. She exhibits normal muscle tone. Coordination normal.  Skin: No rash noted. She is not diaphoretic.    ED Course  Procedures (including critical care time) Labs Review Labs Reviewed  RAPID STREP SCREEN   Imaging Review No results found.  EKG Interpretation   None       MDM   1. URI (upper respiratory infection)    23 year old female presents with sore throat and URI symptoms. URI symptoms present for one day. Patient has not tried anything for this. No fevers. No cough. Just nasal congestion today. She also is complaining of a sore throat for the past 3 weeks. She describes a scratchy itchy throat which is sore and having mild amount aphasia. She is not having dysphagia. She did have a couple episodes tonight of a few seconds in duration where she thought her throat was closer to and she couldn't swallow, these resolved on their own. She is not having trouble breathing. She has a normal exam with no stridor, and she has a clear oropharynx without swelling, redness. I think this is likely due to postnasal drip. I will check a strep screen. No concern for airway compromise. No need for emergent airway management. She is sleeping initially upon exam.  Strep negative, stable for discharge.    Dagmar HaitWilliam Akyla Vavrek, MD 04/27/13 [phone]

## 2013-04-29 LAB — CULTURE, GROUP A STREP

## 2013-05-23 ENCOUNTER — Other Ambulatory Visit: Payer: Self-pay | Admitting: Gynecology

## 2013-05-23 ENCOUNTER — Telehealth: Payer: Self-pay | Admitting: Gynecology

## 2013-05-23 DIAGNOSIS — Z3049 Encounter for surveillance of other contraceptives: Secondary | ICD-10-CM

## 2013-05-23 NOTE — Telephone Encounter (Signed)
05/23/13-Pt was informed today that the removal of Implanon and insertion of new is covered at 100%,no copay. Pt already has appt with JF/[facility]

## 2013-06-14 ENCOUNTER — Ambulatory Visit (INDEPENDENT_AMBULATORY_CARE_PROVIDER_SITE_OTHER): Payer: BC Managed Care – PPO | Admitting: Gynecology

## 2013-06-14 ENCOUNTER — Encounter: Payer: Self-pay | Admitting: Gynecology

## 2013-06-14 VITALS — BP 120/79

## 2013-06-14 DIAGNOSIS — Z975 Presence of (intrauterine) contraceptive device: Secondary | ICD-10-CM | POA: Insufficient documentation

## 2013-06-14 DIAGNOSIS — Z30017 Encounter for initial prescription of implantable subdermal contraceptive: Secondary | ICD-10-CM

## 2013-06-14 DIAGNOSIS — Z3046 Encounter for surveillance of implantable subdermal contraceptive: Secondary | ICD-10-CM

## 2013-06-14 NOTE — Progress Notes (Signed)
   The patient presented to the office today to remove her Nexplanon and to replace with a new one today.                                                             Nexplanon procedure note (removal)/insertion  The patient presented to the office today requesting for removal of her Nexplanon that was placed in the year 2012 on her left arm.   On examination the nexplanon implant was palpated and the distal end  (end  closest to the elbow) was marked. The area was sterilized with Betadine solution. 1% lidocaine was used for local anesthesia and approximately 1 cc  was injected into the site that was marked where  the incision was to be made. The local anesthetic was injected under the implant in an effort to keep it  close to the skin surface. Slight pressure pushing downward was made at the proximal end  of the implant in an effort to stabilize it. A bulge appeared indicating the distal end of the implant. Starting at the distal tip of the implant, a small longitudinal incision of 2 mm was made towards the elbow. By gently pushing the implant toward the incision the tip became visible. Grasping the implant with a curved forcep facilitated in gently removing the implant. Full confirmation of the entire implant which is 4 cm long was inspected and was intact and was shown to the patient and discarded. After removing the Nexplanon the  area was anesthetized with 1% lidocaine  (1 cc)  at the area the injection site and underneath the skin along the planned insertion tunnel. The preloaded disposable Nexplanon was removed from its sterile casing.  The applicator was held above the needle at the textured surface area. The transparent protector was removed. With a freehand, the skin was stretched around the insertion site with a thumb and index finger. The skin was then punctured with the tip of the needle angled at 30. The Nexplanon applicator was lowered to a horizontal position. While lifting the skin with the  tip of the needle the needle was then slid to its full length. The applicator was kept in sitting position with a needle inserted to its full length. The purple slider was unlocked by pushing it slightly downward. The slider was fully moved back until it stopped. This allowed the implant to be in the final subdermal position and the needle was locked inside the body of the applicator. The applicator was then removed. A Steri-Strip was made over the incision and a Kerlix wrap was placed which patient is to remove tomorrow. No complications patient tolerated procedure well and was released home with instructions.   [facility] Surgery Center LLCFERNANDEZ,Jamont Mellin HMD11:16 AMTD@  Note: This dictation was prepared with  Dragon/digital dictation along withSmart phrase technology. Any transcriptional errors that result from this process are unintentional.

## 2013-06-14 NOTE — Patient Instructions (Signed)
Etonogestrel implant What is this medicine? ETONOGESTREL (et oh noe JES trel) is a contraceptive (birth control) device. It is used to prevent pregnancy. It can be used for up to 3 years. This medicine may be used for other purposes; ask your health care provider or pharmacist if you have questions. COMMON BRAND NAME(S): Implanon, Nexplanon  What should I tell my health care provider before I take this medicine? They need to know if you have any of these conditions: -abnormal vaginal bleeding -blood vessel disease or blood clots -cancer of the breast, cervix, or liver -depression -diabetes -gallbladder disease -headaches -heart disease or recent heart attack -high blood pressure -high cholesterol -kidney disease -liver disease -renal disease -seizures -tobacco smoker -an unusual or allergic reaction to etonogestrel, other hormones, anesthetics or antiseptics, medicines, foods, dyes, or preservatives -pregnant or trying to get pregnant -breast-feeding How should I use this medicine? This device is inserted just under the skin on the inner side of your upper arm by a health care professional. Talk to your pediatrician regarding the use of this medicine in children. Special care may be needed. Overdosage: If you think you've taken too much of this medicine contact a poison control center or emergency room at once. Overdosage: If you think you have taken too much of this medicine contact a poison control center or emergency room at once. NOTE: This medicine is only for you. Do not share this medicine with others. What if I miss a dose? This does not apply. What may interact with this medicine? Do not take this medicine with any of the following medications: -amprenavir -bosentan -fosamprenavir This medicine may also interact with the following medications: -barbiturate medicines for inducing sleep or treating seizures -certain medicines for fungal infections like ketoconazole and  itraconazole -griseofulvin -medicines to treat seizures like carbamazepine, felbamate, oxcarbazepine, phenytoin, topiramate -modafinil -phenylbutazone -rifampin -some medicines to treat HIV infection like atazanavir, indinavir, lopinavir, nelfinavir, tipranavir, ritonavir -St. John's wort This list may not describe all possible interactions. Give your health care provider a list of all the medicines, herbs, non-prescription drugs, or dietary supplements you use. Also tell them if you smoke, drink alcohol, or use illegal drugs. Some items may interact with your medicine. What should I watch for while using this medicine? This product does not protect you against HIV infection (AIDS) or other sexually transmitted diseases. You should be able to feel the implant by pressing your fingertips over the skin where it was inserted. Tell your doctor if you cannot feel the implant. What side effects may I notice from receiving this medicine? Side effects that you should report to your doctor or health care professional as soon as possible: -allergic reactions like skin rash, itching or hives, swelling of the face, lips, or tongue -breast lumps -changes in vision -confusion, trouble speaking or understanding -dark urine -depressed mood -general ill feeling or flu-like symptoms -light-colored stools -loss of appetite, nausea -right upper belly pain -severe headaches -severe pain, swelling, or tenderness in the abdomen -shortness of breath, chest pain, swelling in a leg -signs of pregnancy -sudden numbness or weakness of the face, arm or leg -trouble walking, dizziness, loss of balance or coordination -unusual vaginal bleeding, discharge -unusually weak or tired -yellowing of the eyes or skin Side effects that usually do not require medical attention (Report these to your doctor or health care professional if they continue or are bothersome.): -acne -breast pain -changes in  weight -cough -fever or chills -headache -irregular menstrual bleeding -itching, burning,   and vaginal discharge -pain or difficulty passing urine -sore throat This list may not describe all possible side effects. Call your doctor for medical advice about side effects. You may report side effects to FDA at 1-800-FDA-1088. Where should I keep my medicine? This drug is given in a hospital or clinic and will not be stored at home. NOTE: This sheet is a summary. It may not cover all possible information. If you have questions about this medicine, talk to your doctor, pharmacist, or health care provider.  2014, Elsevier/Gold Standard. (2011-09-22 15:37:45)  

## 2013-06-15 ENCOUNTER — Other Ambulatory Visit: Payer: Self-pay | Admitting: *Deleted

## 2013-06-15 DIAGNOSIS — Z3049 Encounter for surveillance of other contraceptives: Secondary | ICD-10-CM

## 2013-07-05 ENCOUNTER — Emergency Department (HOSPITAL_BASED_OUTPATIENT_CLINIC_OR_DEPARTMENT_OTHER)
Admission: EM | Admit: 2013-07-05 | Discharge: 2013-07-05 | Disposition: A | Discharge status: Home / Self Care | Payer: BC Managed Care – PPO | Attending: Emergency Medicine | Admitting: Emergency Medicine

## 2013-07-05 ENCOUNTER — Encounter (HOSPITAL_BASED_OUTPATIENT_CLINIC_OR_DEPARTMENT_OTHER): Payer: Self-pay | Admitting: Emergency Medicine

## 2013-07-05 DIAGNOSIS — R112 Nausea with vomiting, unspecified: Secondary | ICD-10-CM | POA: Insufficient documentation

## 2013-07-05 DIAGNOSIS — R51 Headache: Secondary | ICD-10-CM | POA: Insufficient documentation

## 2013-07-05 DIAGNOSIS — R519 Headache, unspecified: Secondary | ICD-10-CM

## 2013-07-05 DIAGNOSIS — H53149 Visual discomfort, unspecified: Secondary | ICD-10-CM | POA: Insufficient documentation

## 2013-07-05 DIAGNOSIS — I10 Essential (primary) hypertension: Secondary | ICD-10-CM | POA: Insufficient documentation

## 2013-07-05 DIAGNOSIS — Z87410 Personal history of cervical dysplasia: Secondary | ICD-10-CM | POA: Insufficient documentation

## 2013-07-05 DIAGNOSIS — Z792 Long term (current) use of antibiotics: Secondary | ICD-10-CM | POA: Insufficient documentation

## 2013-07-05 DIAGNOSIS — Z79899 Other long term (current) drug therapy: Secondary | ICD-10-CM | POA: Insufficient documentation

## 2013-07-05 DIAGNOSIS — Z8619 Personal history of other infectious and parasitic diseases: Secondary | ICD-10-CM | POA: Insufficient documentation

## 2013-07-05 DIAGNOSIS — Z3202 Encounter for pregnancy test, result negative: Secondary | ICD-10-CM | POA: Insufficient documentation

## 2013-07-05 LAB — PREGNANCY, URINE: PREG TEST UR: NEGATIVE

## 2013-07-05 MED ORDER — METOCLOPRAMIDE HCL 10 MG PO TABS
10.0000 mg | ORAL_TABLET | Freq: Once | ORAL | Status: AC
  Administered 2013-07-05: 10 mg via ORAL
  Filled 2013-07-05: qty 1

## 2013-07-05 MED ORDER — KETOROLAC TROMETHAMINE 60 MG/2ML IM SOLN
60.0000 mg | Freq: Once | INTRAMUSCULAR | Status: AC
  Administered 2013-07-05: 60 mg via INTRAMUSCULAR
  Filled 2013-07-05: qty 2

## 2013-07-05 NOTE — ED Provider Notes (Signed)
CSN: [id number]     Arrival date & time 07/05/13  4098 History   First MD Initiated Contact with Patient 07/05/13 [phone]     Chief Complaint  Patient presents with  . sinus pressure, headache      (Consider location/radiation/quality/duration/timing/severity/associated sxs/prior Treatment) Patient is a 23 y.o. female presenting with headaches.  Headache Pain location:  L temporal and frontal Quality:  Dull (Pressure) Radiates to:  Does not radiate Pain severity now: Severe. Onset quality:  Gradual Duration:  2 days Timing:  Constant Progression:  Worsening Chronicity:  Recurrent Context comment:  History of headaches similar to this one. Relieved by:  Nothing Worsened by:  Light Ineffective treatments:  Acetaminophen Associated symptoms: nausea, photophobia and vomiting   Associated symptoms: no congestion, no cough, no ear pain, no fever, no sinus pressure and no weakness     Past Medical History  Diagnosis Date  . Hypertension   . Normal spontaneous vaginal delivery   . 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 Cervical dysplasia    Past Surgical History  Procedure Laterality Date  . Tonsillectomy and adenoidectomy  AGE 85 (2010)   Family History  Problem Relation Age of Onset  . Hypertension Mother    History  Substance Use Topics  . Smoking status: Never Smoker   . Smokeless tobacco: Never Used  . Alcohol Use: No   OB History   Grav Para Term Preterm Abortions TAB SAB Ect Mult Living   1 1        1      Review of Systems  Constitutional: Negative for fever.  HENT: Negative for congestion, ear pain and sinus pressure.   Eyes: Positive for photophobia.  Respiratory: Negative for cough.   Gastrointestinal: Positive for nausea and vomiting.  Neurological: Positive for headaches.  All other systems reviewed and are negative.      Allergies  Review of patient's allergies indicates no known allergies.  Home Medications   Current Outpatient Rx  Name   Route  Sig  Dispense  Refill  . acetaminophen (TYLENOL) 325 MG tablet   Oral   Take 650 mg by mouth every 6 (six) hours as needed.         Marland Kitchen aspirin-acetaminophen-caffeine (EXCEDRIN MIGRAINE) 250-250-65 MG per tablet   Oral   Take by mouth every 6 (six) hours as needed for headache.         Marland Kitchen atenolol (TENORMIN) 25 MG tablet   Oral   Take 1 tablet (25 mg total) by mouth daily.   30 tablet   1   . azithromycin (ZITHROMAX) 250 MG tablet   Oral   Take 1 tablet (250 mg total) by mouth once.   4 tablet   0   . cefUROXime (CEFTIN) 250 MG tablet   Oral   Take 1 tablet (250 mg total) by mouth 2 (two) times daily.   14 tablet   0   . Etonogestrel (IMPLANON [place])   Subcutaneous   Inject into the skin. Inserted left arm 05/29/08         . fluconazole (DIFLUCAN) 150 MG tablet   Oral   Take 1 tablet (150 mg total) by mouth once.   1 tablet   0    BP 160/81  Pulse 72  Temp(Src) 98.5 F (36.9 C) (Oral)  Resp 18  Ht 5\' 4"  (1.626 m)  Wt 240 lb (108.863 kg)  BMI 41.18 kg/m2  SpO2 100% Physical Exam  Nursing note and vitals  reviewed. Constitutional: She is oriented to person, place, and time. She appears well-developed and well-nourished. No distress.  HENT:  Head: Normocephalic and atraumatic.  Eyes: Conjunctivae are normal. No scleral icterus.  Neck: Neck supple.  Cardiovascular: Normal rate and intact distal pulses.   Pulmonary/Chest: Effort normal. No stridor. No respiratory distress.  Abdominal: Normal appearance. She exhibits no distension.  Neurological: She is alert and oriented to person, place, and time. She has normal strength. No cranial nerve deficit. Coordination and gait normal. GCS eye subscore is 4. GCS verbal subscore is 5. GCS motor subscore is 6.  Reflex Scores:      Patellar reflexes are 1+ on the right side and 1+ on the left side. Skin: Skin is warm and dry. No rash noted.  Psychiatric: She has a normal mood and affect. Her behavior is normal.     ED Course  Procedures (including critical care time) Labs Review Labs Reviewed  PREGNANCY, URINE   Imaging Review No results found.   EKG Interpretation None      MDM   Final diagnoses:  Headache    23 year old female presenting with a left-sided headache. Started gradually yesterday. Tylenol helped yesterday, but headache worse today. No fevers, no neurologic symptoms or signs. No sinus congestion or tenderness.  It is not consistent with meningitis or SAH.  She has had some nausea and vomiting, as well as photophobia. Plan by mouth Reglan and IM Toradol.  Headache better, discharged home.  Candyce ChurnJohn David Meganne Rita III, MD 07/05/13 [phone]

## 2013-07-05 NOTE — Discharge Instructions (Signed)

## 2013-07-05 NOTE — ED Notes (Signed)
Pt states she is nauseous and has no appetite.

## 2013-07-05 NOTE — ED Notes (Signed)
Pt amb out of room, requests "something to eat and drink", pt also reports "I just threw up..." pt states she walked to the restroom and threw up in the toilet. Pt informed that she needs to be npo if she is throwing up. Will alert md.

## 2013-07-05 NOTE — ED Notes (Signed)
Pt amb to room 10 with quick steady gait smiling in nad. Pt reports awakening with ha yesterday morning, pressure behind her eyes.

## 2013-08-08 ENCOUNTER — Ambulatory Visit: Payer: BC Managed Care – PPO | Admitting: Gynecology

## 2013-09-06 ENCOUNTER — Ambulatory Visit: Payer: BC Managed Care – PPO | Admitting: Gynecology

## 2014-01-30 ENCOUNTER — Encounter (HOSPITAL_BASED_OUTPATIENT_CLINIC_OR_DEPARTMENT_OTHER): Payer: Self-pay | Admitting: Emergency Medicine

## 2014-03-31 DIAGNOSIS — A749 Chlamydial infection, unspecified: Secondary | ICD-10-CM

## 2014-03-31 HISTORY — DX: Chlamydial infection, unspecified: A74.9

## 2014-04-03 ENCOUNTER — Encounter: Payer: BC Managed Care – PPO | Admitting: Gynecology

## 2014-08-25 ENCOUNTER — Emergency Department (HOSPITAL_BASED_OUTPATIENT_CLINIC_OR_DEPARTMENT_OTHER)
Admission: EM | Admit: 2014-08-25 | Discharge: 2014-08-25 | Disposition: A | Discharge status: Home / Self Care | Payer: BLUE CROSS/BLUE SHIELD | Attending: Emergency Medicine | Admitting: Emergency Medicine

## 2014-08-25 ENCOUNTER — Encounter (HOSPITAL_BASED_OUTPATIENT_CLINIC_OR_DEPARTMENT_OTHER): Payer: Self-pay | Admitting: *Deleted

## 2014-08-25 DIAGNOSIS — B349 Viral infection, unspecified: Secondary | ICD-10-CM | POA: Diagnosis not present

## 2014-08-25 DIAGNOSIS — Z79899 Other long term (current) drug therapy: Secondary | ICD-10-CM | POA: Diagnosis not present

## 2014-08-25 DIAGNOSIS — Z8742 Personal history of other diseases of the female genital tract: Secondary | ICD-10-CM | POA: Insufficient documentation

## 2014-08-25 DIAGNOSIS — I10 Essential (primary) hypertension: Secondary | ICD-10-CM | POA: Insufficient documentation

## 2014-08-25 DIAGNOSIS — R000 Tachycardia, unspecified: Secondary | ICD-10-CM | POA: Diagnosis not present

## 2014-08-25 DIAGNOSIS — J029 Acute pharyngitis, unspecified: Secondary | ICD-10-CM | POA: Diagnosis present

## 2014-08-25 DIAGNOSIS — Z8619 Personal history of other infectious and parasitic diseases: Secondary | ICD-10-CM | POA: Insufficient documentation

## 2014-08-25 LAB — RAPID STREP SCREEN (MED CTR MEBANE ONLY): Streptococcus, Group A Screen (Direct): NEGATIVE

## 2014-08-25 MED ORDER — ACETAMINOPHEN 325 MG PO TABS
650.0000 mg | ORAL_TABLET | Freq: Once | ORAL | Status: AC
  Administered 2014-08-25: 650 mg via ORAL
  Filled 2014-08-25: qty 2

## 2014-08-25 NOTE — ED Notes (Signed)
Pt c/o sore throat that began 2 days ago. Today she began experiencing body aches and fever of 101. Pt denies N/V/D.

## 2014-08-25 NOTE — ED Provider Notes (Addendum)
CSN: [id number]     Arrival date & time 08/25/14  1836 History  This chart was scribed for Gwyneth Sprout, MD by Phillis Haggis, ED Scribe. This patient was seen in room MH06/MH06 and patient care was started at 6:53 PM.   Chief Complaint  Patient presents with  . Sore Throat   The history is provided by the patient. No language interpreter was used.  HPI Comments: Sara Benson is a 24 y.o. female who presents to the Emergency Department complaining of sore throat onset three days ago. She reports associated fever that started today, tmax 101 F, congestion and abdominal pain. She states that she was given Tylenol for her fever on arrival to the ED, but has not taken any other medications. She denies cough. She denies any sick contacts. She states that she take atenolol for her HTN.  She states that she does not have regular periods because of the [facility] that she is on. Her mother states that the patient does not have her tonsils. Patient states that she works at [employer].  Past Medical History  Diagnosis Date  . Hypertension   . Normal spontaneous vaginal delivery   . 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 Cervical dysplasia    Past Surgical History  Procedure Laterality Date  . Tonsillectomy and adenoidectomy  AGE 40 (2010)   Family History  Problem Relation Age of Onset  . Hypertension Mother    History  Substance Use Topics  . Smoking status: Never Smoker   . Smokeless tobacco: Never Used  . Alcohol Use: No   OB History    Gravida Para Term Preterm AB TAB SAB Ectopic Multiple Living   Review of Systems A complete 10 system review of systems was obtained and all systems are negative except as noted in the HPI and PMH.   Allergies  Review of patient's allergies indicates no known allergies.  Home Medications   Prior to Admission medications   Medication Sig Start Date End Date Taking? Authorizing Provider  acetaminophen (TYLENOL) 325 MG tablet  Take 650 mg by mouth every 6 (six) hours as needed.    Historical Provider, MD  aspirin-acetaminophen-caffeine (EXCEDRIN MIGRAINE) [phone] MG per tablet Take by mouth every 6 (six) hours as needed for headache.    Historical Provider, MD  atenolol (TENORMIN) 25 MG tablet Take 1 tablet (25 mg total) by mouth daily. 02/07/13   Ok Edwards, MD  azithromycin (ZITHROMAX) 250 MG tablet Take 1 tablet (250 mg total) by mouth once. 02/09/13   Ok Edwards, MD  cefUROXime (CEFTIN) 250 MG tablet Take 1 tablet (250 mg total) by mouth 2 (two) times daily. 02/07/13   Ok Edwards, MD  Etonogestrel [facility]) Inject into the skin. Inserted left arm 05/29/08    Historical Provider, MD  fluconazole (DIFLUCAN) 150 MG tablet Take 1 tablet (150 mg total) by mouth once. 02/07/13   Ok Edwards, MD   BP 168/107 mmHg  Pulse 105  Temp(Src) 101.1 F (38.4 C) (Oral)  Resp 18  Ht  (1.626 m)  Wt 254 lb (115.214 kg)  BMI 43.58 kg/m2  SpO2 99%  Physical Exam  Constitutional: She is oriented to person, place, and time. She appears well-developed and well-nourished.  HENT:  Head: Normocephalic and atraumatic.  Eyes: EOM are normal.  Neck: Normal range of motion. Neck supple.  Pharyngeal erythema without exudate or edema.  Bilateral middle ear effusion without edema; no cervical lymphadenopathy  Cardiovascular: Tachycardia present.   Pulmonary/Chest: Effort normal.  Lymphadenopathy:    She has no cervical adenopathy.  Neurological: She is alert and oriented to person, place, and time.  Skin: Skin is warm and dry.  Psychiatric: She has a normal mood and affect. Her behavior is normal.  Nursing note and vitals reviewed.   ED Course  Procedures (including critical care time) DIAGNOSTIC STUDIES: Oxygen Saturation is 99% on room air, normal by my interpretation.    COORDINATION OF CARE: 6:55 PM-Discussed treatment plan which includes labs, a work note and drinking lots of fluids with pt  at bedside and pt agreed to plan.   Labs Review Labs Reviewed  RAPID STREP SCREEN (NOT AT [facility])  CULTURE, GROUP A STREP    Imaging Review No results found.   EKG Interpretation None      MDM   Final diagnoses:  Viral syndrome    Patient with a sore throat and viral syndrome presenting here today with a fever. No notable exudates present where lymphadenopathy. Sharp screen is negative. Patient most likely has a viral syndrome as she does work at [employer] and is constantly coming in contact with sick people.  Patient has a history of hypertension but is otherwise healthy. She is currently only taking atenolol. Encouraged patient to use Tylenol. Rest and fluids.  I personally performed the services described in this documentation, which was scribed in my presence.  The recorded information has been reviewed and considered.    Gwyneth SproutWhitney Jerra Huckeby, MD 08/25/14 1929  Gwyneth SproutWhitney Kolston Lacount, MD 08/25/14 [id number]

## 2014-08-29 LAB — CULTURE, GROUP A STREP

## 2014-09-27 ENCOUNTER — Encounter: Payer: Self-pay | Admitting: Gynecology

## 2014-10-25 ENCOUNTER — Encounter: Payer: Self-pay | Admitting: Gynecology

## 2014-11-27 ENCOUNTER — Encounter: Payer: Self-pay | Admitting: Gynecology

## 2014-11-27 ENCOUNTER — Other Ambulatory Visit (HOSPITAL_COMMUNITY)
Admission: RE | Admit: 2014-11-27 | Discharge: 2014-11-27 | Disposition: A | Discharge status: Home / Self Care | Payer: BLUE CROSS/BLUE SHIELD | Source: Ambulatory Visit | Attending: Gynecology | Admitting: Gynecology

## 2014-11-27 ENCOUNTER — Ambulatory Visit (INDEPENDENT_AMBULATORY_CARE_PROVIDER_SITE_OTHER): Payer: BLUE CROSS/BLUE SHIELD | Admitting: Gynecology

## 2014-11-27 VITALS — BP 140/88 | Ht 66.0 in | Wt 247.0 lb

## 2014-11-27 DIAGNOSIS — Z01419 Encounter for gynecological examination (general) (routine) without abnormal findings: Secondary | ICD-10-CM

## 2014-11-27 DIAGNOSIS — Z113 Encounter for screening for infections with a predominantly sexual mode of transmission: Secondary | ICD-10-CM | POA: Diagnosis not present

## 2014-11-27 DIAGNOSIS — Z87410 Personal history of cervical dysplasia: Secondary | ICD-10-CM

## 2014-11-27 LAB — CBC WITH DIFFERENTIAL/PLATELET
BASOS ABS: 0 10*3/uL (ref 0.0–0.1)
Basophils Relative: 0 % (ref 0–1)
EOS ABS: 0.2 10*3/uL (ref 0.0–0.7)
Eosinophils Relative: 2 % (ref 0–5)
HCT: 42 % (ref 36.0–46.0)
Hemoglobin: 14.2 g/dL (ref 12.0–15.0)
LYMPHS ABS: 2.4 10*3/uL (ref 0.7–4.0)
Lymphocytes Relative: 23 % (ref 12–46)
MCH: 28.5 pg (ref 26.0–34.0)
MCHC: 33.8 g/dL (ref 30.0–36.0)
MCV: 84.2 fL (ref 78.0–100.0)
MPV: 9.5 fL (ref 8.6–12.4)
Monocytes Absolute: 0.4 10*3/uL (ref 0.1–1.0)
Monocytes Relative: 4 % (ref 3–12)
NEUTROS PCT: 71 % (ref 43–77)
Neutro Abs: 7.4 10*3/uL (ref 1.7–7.7)
PLATELETS: 387 10*3/uL (ref 150–400)
RBC: 4.99 MIL/uL (ref 3.87–5.11)
RDW: 13.9 % (ref 11.5–15.5)
WBC: 10.4 10*3/uL (ref 4.0–10.5)

## 2014-11-27 LAB — COMPREHENSIVE METABOLIC PANEL
ALK PHOS: 107 U/L (ref 33–115)
ALT: 34 U/L — ABNORMAL HIGH (ref 6–29)
AST: 17 U/L (ref 10–30)
Albumin: 4.4 g/dL (ref 3.6–5.1)
BILIRUBIN TOTAL: 0.3 mg/dL (ref 0.2–1.2)
BUN: 14 mg/dL (ref 7–25)
CO2: 24 mmol/L (ref 20–31)
CREATININE: 0.72 mg/dL (ref 0.50–1.10)
Calcium: 9.3 mg/dL (ref 8.6–10.2)
Chloride: 105 mmol/L (ref 98–110)
GLUCOSE: 112 mg/dL — AB (ref 65–99)
Potassium: 4.3 mmol/L (ref 3.5–5.3)
SODIUM: 141 mmol/L (ref 135–146)
Total Protein: 7.5 g/dL (ref 6.1–8.1)

## 2014-11-27 LAB — HEMOGLOBIN A1C
Hgb A1c MFr Bld: 5.8 % — ABNORMAL HIGH (ref <5.7)
Mean Plasma Glucose: 120 mg/dL — ABNORMAL HIGH (ref <117)

## 2014-11-27 LAB — CHOLESTEROL, TOTAL: CHOLESTEROL: 243 mg/dL — AB (ref 125–200)

## 2014-11-27 LAB — TSH: TSH: 0.916 u[IU]/mL (ref 0.350–4.500)

## 2014-11-27 NOTE — Progress Notes (Signed)
Sara Benson [birth year] [id number]   History:    24 y.o.  for annual gyn exam who is concerned is of increased weight gain since she has been on the Nexplanon. This is the second round for her on the Nexplanon. 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 Patient was treated. She was to return to the office in 6 months for follow-up Pap smear and did not do so. Pap smear November 2014 negative except for yeast  Patient has stated that she has completed the HPV vaccine series in the past. Patient had HIV, RPR, hepatitis B and C testing in 2014 and was negative does not want to be tested today has not had any new partners.  Past medical history,surgical history, family history and social history were all reviewed and documented in the EPIC chart.  Gynecologic History No LMP recorded. Patient has had an implant. Contraception: Nexplanon Last Pap: 2014. Results were: normal Last mammogram: Not indicated. Results were: Not indicated  Obstetric History OB History  Gravida Para Term Preterm AB SAB TAB Ectopic Multiple Living  # Outcome Date GA Lbr Len/2nd Weight Sex  Delivery Anes PTL Lv  1 Para     M Vag-Spont          ROS: A ROS was performed and pertinent positives and negatives are included in the history.  GENERAL: No fevers or chills. HEENT: No change in vision, no earache, sore throat or sinus congestion. NECK: No pain or stiffness. CARDIOVASCULAR: No chest pain or pressure. No palpitations. PULMONARY: No shortness of breath, cough or wheeze. GASTROINTESTINAL: No abdominal pain, nausea, vomiting or diarrhea, melena or bright red blood per rectum. GENITOURINARY: No urinary frequency, urgency, hesitancy or dysuria. MUSCULOSKELETAL: No joint or muscle pain, no back pain, no recent trauma. DERMATOLOGIC: No rash, no itching, no lesions. ENDOCRINE: No polyuria, polydipsia, no heat or cold intolerance. No recent change in weight. HEMATOLOGICAL: No anemia or easy bruising or bleeding. NEUROLOGIC: No headache, seizures, numbness, tingling or weakness. PSYCHIATRIC: No depression, no loss of interest in normal activity or change in sleep pattern.     Exam: chaperone present  BP 140/88 mmHg  Ht  (1.676 m)  Wt 247 lb (112.038 kg)  BMI 39.89 kg/m2  Body mass index is 39.89 kg/(m^2).  General appearance : Well developed well nourished female. No acute distress HEENT: Eyes: no retinal hemorrhage or exudates,  Neck supple, trachea midline, no carotid bruits, no thyroidmegaly Lungs: Clear to auscultation, no rhonchi or wheezes, or rib retractions  Heart: Regular rate and rhythm, no murmurs or gallops Breast:Examined in sitting and supine position were symmetrical in appearance, no palpable masses or tenderness,  no skin retraction, no nipple inversion, no nipple discharge, no skin discoloration, no axillary or supraclavicular lymphadenopathy Abdomen: no  palpable masses or tenderness, no rebound or guarding Extremities: no edema or skin discoloration or tenderness  Pelvic:  Bartholin, Urethra, Skene Glands: Within normal limits             Vagina: No gross  lesions or discharge  Cervix: No gross lesions or discharge  Uterus  anteverted, normal size, shape and consistency, non-tender and mobile  Adnexa  Without masses or tenderness  Anus and perineum  normal   Rectovaginal  normal sphincter tone without palpated masses or tenderness             Hemoccult not indicated     Assessment/Plan:  24 y.o. female for annual exam with past history of cervical dysplasia as described above. Pap smear was done today. GC and Chlamydia culture was obtained. The following labs were ordered: Comprehensive metabolic panel, hemoglobin A1c, screening cholesterol, CBC, and urinalysis. Literature information on the nonhormonal contraception ParaGard T380A IUD was provided. We discussed importance of exercise and healthy diet.   Ok Edwards MD, 11:56 AM 11/27/2014

## 2014-11-28 ENCOUNTER — Other Ambulatory Visit: Payer: Self-pay | Admitting: Gynecology

## 2014-11-28 ENCOUNTER — Encounter: Payer: Self-pay | Admitting: Gynecology

## 2014-11-28 DIAGNOSIS — Z113 Encounter for screening for infections with a predominantly sexual mode of transmission: Secondary | ICD-10-CM

## 2014-11-28 DIAGNOSIS — R7989 Other specified abnormal findings of blood chemistry: Secondary | ICD-10-CM

## 2014-11-28 DIAGNOSIS — R945 Abnormal results of liver function studies: Secondary | ICD-10-CM

## 2014-11-28 DIAGNOSIS — R7309 Other abnormal glucose: Secondary | ICD-10-CM

## 2014-11-28 DIAGNOSIS — E7800 Pure hypercholesterolemia, unspecified: Secondary | ICD-10-CM

## 2014-11-28 LAB — GC/CHLAMYDIA PROBE AMP
CT Probe RNA: POSITIVE — AB
GC Probe RNA: NEGATIVE

## 2014-11-28 LAB — URINALYSIS W MICROSCOPIC + REFLEX CULTURE
BILIRUBIN URINE: NEGATIVE
CRYSTALS: NONE SEEN [HPF]
Casts: NONE SEEN [LPF]
Glucose, UA: NEGATIVE
Hgb urine dipstick: NEGATIVE
KETONES UR: NEGATIVE
Nitrite: NEGATIVE
SPECIFIC GRAVITY, URINE: 1.023 (ref 1.001–1.035)
Yeast: NONE SEEN [HPF]
pH: 7.5 (ref 5.0–8.0)

## 2014-11-28 MED ORDER — AZITHROMYCIN 500 MG PO TABS: 1000.0000 mg | ORAL_TABLET | Freq: Every day | ORAL | Status: DC

## 2014-11-29 LAB — URINE CULTURE

## 2014-11-29 LAB — CYTOLOGY - PAP

## 2014-12-06 ENCOUNTER — Other Ambulatory Visit: Payer: Self-pay

## 2014-12-25 ENCOUNTER — Ambulatory Visit: Payer: BLUE CROSS/BLUE SHIELD | Admitting: Gynecology

## 2015-01-17 ENCOUNTER — Ambulatory Visit: Payer: BLUE CROSS/BLUE SHIELD | Admitting: Gynecology

## 2015-08-06 ENCOUNTER — Ambulatory Visit: Payer: BLUE CROSS/BLUE SHIELD | Admitting: Gynecology

## 2015-08-06 ENCOUNTER — Encounter (HOSPITAL_BASED_OUTPATIENT_CLINIC_OR_DEPARTMENT_OTHER): Payer: Self-pay | Admitting: *Deleted

## 2015-08-06 ENCOUNTER — Emergency Department (HOSPITAL_BASED_OUTPATIENT_CLINIC_OR_DEPARTMENT_OTHER)
Admission: EM | Admit: 2015-08-06 | Discharge: 2015-08-06 | Disposition: A | Discharge status: Home / Self Care | Payer: BLUE CROSS/BLUE SHIELD | Attending: Emergency Medicine | Admitting: Emergency Medicine

## 2015-08-06 DIAGNOSIS — Z79899 Other long term (current) drug therapy: Secondary | ICD-10-CM | POA: Insufficient documentation

## 2015-08-06 DIAGNOSIS — Z7982 Long term (current) use of aspirin: Secondary | ICD-10-CM | POA: Diagnosis not present

## 2015-08-06 DIAGNOSIS — I10 Essential (primary) hypertension: Secondary | ICD-10-CM

## 2015-08-06 NOTE — ED Notes (Signed)
Woke with neck pain that felt like tension. Later at work she had a headache and nausea. After she got home she took her BP and it was elevated.

## 2015-08-06 NOTE — ED Provider Notes (Signed)
CSN: [id number]     Arrival date & time 08/06/15  1914 History  By signing my name below, I, Linus Galas, attest that this documentation has been prepared under the direction and in the presence of Geoffery Lyons, MD. Electronically Signed: Linus Galas, ED Scribe. 08/06/2015. 9:18 PM.   Chief Complaint  Patient presents with  . Hypertension   The history is provided by the patient. No language interpreter was used.   HPI Comments: Sara Benson is a 25 y.o. female who presents to the Emergency Department complaining of elevated blood pressure with associated nausea. Pt also reports HA and neck pain. Pt reports she has been nauseated in the past when her BP is elevated. She usually takes her BP medication in the morning but did not do so until 5 PM. 4 hours ago.  When she checked her BP 2 hours ago, it was 180/132. Pt suspects her neck pain maybe due to "sleeping on it wrong." She took 2 Excedrin for her HA with mild relief. Pt denies any fevers, chills, CP, SOB, N/V/D or any other symptoms at this time.   Past Medical History  Diagnosis Date  . Hypertension   . Normal spontaneous vaginal delivery   . 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 Cervical dysplasia   . Trichimoniasis   . Chlamydia   . Chlamydia 2016   Past Surgical History  Procedure Laterality Date  . Tonsillectomy and adenoidectomy  AGE 59 (2010)   Family History  Problem Relation Age of Onset  . Hypertension Mother   . Cancer Maternal Grandfather     BLADDER  . Cancer Paternal Grandfather     PROSTATE   Social History  Substance Use Topics  . Smoking status: Never Smoker   . Smokeless tobacco: Never Used  . Alcohol Use: No   OB History    Gravida Para Term Preterm AB TAB SAB Ectopic Multiple Living   Review of Systems A complete 10 system review of systems was obtained and all systems are negative except as noted in the HPI and PMH.   Allergies  Review of patient's allergies  indicates no known allergies.  Home Medications   Prior to Admission medications   Medication Sig Start Date End Date Taking? Authorizing Provider  acetaminophen (TYLENOL) 325 MG tablet Take 650 mg by mouth every 6 (six) hours as needed.    Historical Provider, MD  aspirin-acetaminophen-caffeine (EXCEDRIN MIGRAINE) [phone] MG per tablet Take by mouth every 6 (six) hours as needed for headache.    Historical Provider, MD  atenolol (TENORMIN) 25 MG tablet Take 1 tablet (25 mg total) by mouth daily. 02/07/13   Ok Edwards, MD  azithromycin (ZITHROMAX) 500 MG tablet Take 2 tablets (1,000 mg total) by mouth daily. 11/28/14   Ok Edwards, MD  Etonogestrel [facility]) Inject into the skin. Inserted left arm 05/29/08    Historical Provider, MD  fluconazole (DIFLUCAN) 150 MG tablet Take 1 tablet (150 mg total) by mouth once. Patient not taking: Reported on 11/27/2014 02/07/13   Ok Edwards, MD   BP 180/120 mmHg  Pulse 83  Temp(Src) 98.1 F (36.7 C) (Oral)  Resp 18  Ht  (1.626 m)  Wt 245 lb (111.131 kg)  BMI 42.03 kg/m2  SpO2 100%   Physical Exam  Constitutional: She is oriented to person, place, and time. She appears well-developed and well-nourished. No distress.  HENT:  Head: Normocephalic and atraumatic.  Mouth/Throat: Oropharynx is clear and moist. No oropharyngeal exudate.  Eyes: Conjunctivae and EOM are normal. Pupils are equal, round, and reactive to light.  Neck: Normal range of motion. Neck supple.  No meningismus.  Cardiovascular: Normal rate, regular rhythm, normal heart sounds and intact distal pulses.   No murmur heard. Pulmonary/Chest: Effort normal and breath sounds normal. No respiratory distress.  Abdominal: Soft. There is no tenderness. There is no rebound and no guarding.  Musculoskeletal: Normal range of motion. She exhibits no edema or tenderness.  Neurological: She is alert and oriented to person, place, and time. No cranial nerve deficit. She  exhibits normal muscle tone. Coordination normal.  No ataxia on finger to nose bilaterally. No pronator drift. 5/5 strength throughout. CN 2-12 intact.Equal grip strength. Sensation intact.   Skin: Skin is warm and dry.  Psychiatric: She has a normal mood and affect. Her behavior is normal.  Nursing note and vitals reviewed.   ED Course  Procedures  DIAGNOSTIC STUDIES: Oxygen Saturation is 98% on room air, normal by my interpretation.    COORDINATION OF CARE: 9:13 PM Discussed treatment plan with pt at bedside and pt agreed to plan.  MDM   Final diagnoses:  None    Blood pressure much improved here in the ER. I see no indication for any workup. She was late this afternoon and taking her blood pressure medication and I believe that is what caused her blood pressure to go high. Her neurologic examination is within the limits and I see nothing else that would indicate an emergent situation.  I personally performed the services described in this documentation, which was scribed in my presence. The recorded information has been reviewed and is accurate.       Geoffery Lyonsouglas Riyanshi Wahab, MD 08/06/15 [phone]

## 2015-08-06 NOTE — Discharge Instructions (Signed)
Continue your medications as before.   Hypertension Hypertension, commonly called high blood pressure, is when the force of blood pumping through your arteries is too strong. Your arteries are the blood vessels that carry blood from your heart throughout your body. A blood pressure reading consists of a higher number over a lower number, such as 110/72. The higher number (systolic) is the pressure inside your arteries when your heart pumps. The lower number (diastolic) is the pressure inside your arteries when your heart relaxes. Ideally you want your blood pressure below 120/80. Hypertension forces your heart to work harder to pump blood. Your arteries may become narrow or stiff. Having untreated or uncontrolled hypertension can cause heart attack, stroke, kidney disease, and other problems. RISK FACTORS Some risk factors for high blood pressure are controllable. Others are not.  Risk factors you cannot control include:   Race. You may be at higher risk if you are African American.  Age. Risk increases with age.  Gender. Men are at higher risk than women before age 24 years. After age 24, women are at higher risk than men. Risk factors you can control include:  Not getting enough exercise or physical activity.  Being overweight.  Getting too much fat, sugar, calories, or salt in your diet.  Drinking too much alcohol. SIGNS AND SYMPTOMS Hypertension does not usually cause signs or symptoms. Extremely high blood pressure (hypertensive crisis) may cause headache, anxiety, shortness of breath, and nosebleed. DIAGNOSIS To check if you have hypertension, your health care provider will measure your blood pressure while you are seated, with your arm held at the level of your heart. It should be measured at least twice using the same arm. Certain conditions can cause a difference in blood pressure between your right and left arms. A blood pressure reading that is higher than normal on one occasion  does not mean that you need treatment. If it is not clear whether you have high blood pressure, you may be asked to return on a different day to have your blood pressure checked again. Or, you may be asked to monitor your blood pressure at home for 1 or more weeks. TREATMENT Treating high blood pressure includes making lifestyle changes and possibly taking medicine. Living a healthy lifestyle can help lower high blood pressure. You may need to change some of your habits. Lifestyle changes may include:  Following the DASH diet. This diet is high in fruits, vegetables, and whole grains. It is low in salt, red meat, and added sugars.  Keep your sodium intake below 2,300 mg per day.  Getting at least 30-45 minutes of aerobic exercise at least 4 times per week.  Losing weight if necessary.  Not smoking.  Limiting alcoholic beverages.  Learning ways to reduce stress. Your health care provider may prescribe medicine if lifestyle changes are not enough to get your blood pressure under control, and if one of the following is true:  You are 318-24 years of age and your systolic blood pressure is above 140.  You are 24 years of age or older, and your systolic blood pressure is above 150.  Your diastolic blood pressure is above 90.  You have diabetes, and your systolic blood pressure is over 140 or your diastolic blood pressure is over 90.  You have kidney disease and your blood pressure is above 140/90.  You have heart disease and your blood pressure is above 140/90. Your personal target blood pressure may vary depending on your medical conditions, your  age, and other factors. HOME CARE INSTRUCTIONS  Have your blood pressure rechecked as directed by your health care provider.   Take medicines only as directed by your health care provider. Follow the directions carefully. Blood pressure medicines must be taken as prescribed. The medicine does not work as well when you skip doses. Skipping  doses also puts you at risk for problems.  Do not smoke.   Monitor your blood pressure at home as directed by your health care provider. SEEK MEDICAL CARE IF:   You think you are having a reaction to medicines taken.  You have recurrent headaches or feel dizzy.  You have swelling in your ankles.  You have trouble with your vision. SEEK IMMEDIATE MEDICAL CARE IF:  You develop a severe headache or confusion.  You have unusual weakness, numbness, or feel faint.  You have severe chest or abdominal pain.  You vomit repeatedly.  You have trouble breathing. MAKE SURE YOU:   Understand these instructions.  Will watch your condition.  Will get help right away if you are not doing well or get worse.   This information is not intended to replace advice given to you by your health care provider. Make sure you discuss any questions you have with your health care provider.   Document Released: 03/17/2005 Document Revised: 08/01/2014 Document Reviewed: 01/07/2013 Elsevier Interactive Patient Education [employer]

## 2015-08-06 NOTE — ED Notes (Signed)
MD at bedside. 

## 2015-08-06 NOTE — ED Notes (Signed)
Pt had not taken her blood pressure medication when she noticed she had high blood pressure today.  She took it around 1730 and her blood pressure has since come down.  Pt also c/o headache that she took OTC medication this morning for, and that too has gotten better since her blood pressure has come down.

## 2015-08-06 NOTE — ED Notes (Signed)
Pt verbalizes understanding of d/c instructions and denies any further need at this time. 

## 2015-08-06 NOTE — ED Notes (Signed)
Updated pt to plan of care 

## 2015-12-11 ENCOUNTER — Telehealth: Payer: Self-pay | Admitting: *Deleted

## 2015-12-11 NOTE — Telephone Encounter (Signed)
Pt called stating she needs records sent to office,pt informed to fill out medical release form.pt will do.

## 2015-12-13 ENCOUNTER — Encounter: Payer: BLUE CROSS/BLUE SHIELD | Admitting: Gynecology

## 2015-12-18 ENCOUNTER — Ambulatory Visit (INDEPENDENT_AMBULATORY_CARE_PROVIDER_SITE_OTHER): Payer: BLUE CROSS/BLUE SHIELD | Admitting: Gynecology

## 2015-12-18 ENCOUNTER — Encounter: Payer: Self-pay | Admitting: Gynecology

## 2015-12-18 VITALS — BP 126/80 | Ht 66.0 in | Wt 251.0 lb

## 2015-12-18 DIAGNOSIS — N898 Other specified noninflammatory disorders of vagina: Secondary | ICD-10-CM

## 2015-12-18 DIAGNOSIS — Z01419 Encounter for gynecological examination (general) (routine) without abnormal findings: Secondary | ICD-10-CM

## 2015-12-18 DIAGNOSIS — B373 Candidiasis of vulva and vagina: Secondary | ICD-10-CM | POA: Diagnosis not present

## 2015-12-18 DIAGNOSIS — B3731 Acute candidiasis of vulva and vagina: Secondary | ICD-10-CM

## 2015-12-18 DIAGNOSIS — Z113 Encounter for screening for infections with a predominantly sexual mode of transmission: Secondary | ICD-10-CM | POA: Diagnosis not present

## 2015-12-18 LAB — WET PREP FOR TRICH, YEAST, CLUE
Clue Cells Wet Prep HPF POC: NONE SEEN
TRICH WET PREP: NONE SEEN

## 2015-12-18 MED ORDER — FLUCONAZOLE 150 MG PO TABS: 150.0000 mg | ORAL_TABLET | Freq: Once | ORAL | 0 refills | Status: AC

## 2015-12-18 NOTE — Progress Notes (Signed)
Sara FormJasmine Benson May 09, 1990 [id number]   History:    25 y.o.  for annual gyn exam with a complaint of vaginal discharge. She has a new sexual partner. Patient with history of multiple STDs in the past. 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 Patient was treated. She was to return to the office in 6 months for follow-up Pap smear and did not do so. Pap smear November 2014 negative except for yeast Pap smear 2016 was normal  Patient has stated that she has completed the HPV vaccine series in the past. Patient had HIV, RPR, hepatitis B and C testing in 2014 and was negative   Patient declined flu vaccine today.  Past medical history,surgical history, family history and social history were all reviewed and documented in the EPIC chart.  Gynecologic History No LMP recorded. Patient has had an implant. Contraception: Nexplanon Last Pap: 2016. Results were: normal Last mammogram: Not indicated. Results were: Not  indicated  Obstetric History OB History  Gravida Para Term Preterm AB Living  1 1       1   SAB TAB Ectopic Multiple Live Births               # Outcome Date GA Lbr Len/2nd Weight Sex Delivery Anes PTL Lv  1 Para     M Vag-Spont          ROS: A ROS was performed and pertinent positives and negatives are included in the history.  GENERAL: No fevers or chills. HEENT: No change in vision, no earache, sore throat or sinus congestion. NECK: No pain or stiffness. CARDIOVASCULAR: No chest pain or pressure. No palpitations. PULMONARY: No shortness of breath, cough or wheeze. GASTROINTESTINAL: No abdominal pain, nausea, vomiting or diarrhea, melena or bright red blood per rectum. GENITOURINARY: No urinary frequency, urgency, hesitancy or dysuria. MUSCULOSKELETAL: No joint or muscle pain, no back pain, no recent trauma. DERMATOLOGIC: No rash, no itching, no lesions. ENDOCRINE: No polyuria, polydipsia, no heat or cold intolerance. No recent change in weight. HEMATOLOGICAL: No anemia or easy bruising or bleeding. NEUROLOGIC: No headache, seizures, numbness, tingling or weakness. PSYCHIATRIC: No depression, no loss of interest in normal activity or change in sleep pattern.     Exam: chaperone present  BP 126/80   Ht 5\' 6"  (1.676 m)   Wt 251 lb (113.9 kg)   BMI 40.51 kg/m   Body mass index is 40.51 kg/m.  General appearance : Well developed well nourished female. No acute distress HEENT: Eyes: no retinal hemorrhage or exudates,  Neck supple, trachea midline, no carotid bruits, no thyroidmegaly Lungs: Clear to auscultation, no rhonchi or wheezes, or rib retractions  Heart: Regular rate and rhythm, no murmurs or  gallops Breast:Examined in sitting and supine position were symmetrical in appearance, no palpable masses or tenderness,  no skin retraction, no nipple inversion, no nipple discharge, no skin discoloration, no axillary or supraclavicular lymphadenopathy Abdomen: no palpable masses or  tenderness, no rebound or guarding Extremities: no edema or skin discoloration or tenderness  Pelvic:  Bartholin, Urethra, Skene Glands: Within normal limits             Vagina: No gross lesions or discharge  Cervix: No gross lesions or discharge  Uterus  anteverted, normal size, shape and consistency, non-tender and mobile  Adnexa  Without masses or tenderness  Anus and perineum  normal   Rectovaginal  normal sphincter tone without palpated masses or tenderness             Hemoccult not indicated   Wet prep few yeast, few WBC, few bacteria no clue cells  Assessment/Plan:  25 y.o. female for annual exam with clinical evidence of yeast vaginitis will be prescribed I skin 150 mg 1 by mouth today. Literature information was provided on the ParaGard T380A IUD she'll make arrangements for later time to remove the Nexplanon as well. GC and Chlamydia culture along with HIV, RPR, hepatitis B and C obtained today result pending at time of this dictation. Her family doctor has done her blood work. She declined flu vaccine today. Pap smear was done today.   Ok Edwards MD, 9:41 AM 12/18/2015

## 2015-12-18 NOTE — Patient Instructions (Signed)

## 2015-12-19 LAB — GC/CHLAMYDIA PROBE AMP
CT PROBE, AMP APTIMA: NOT DETECTED
GC Probe RNA: NOT DETECTED

## 2015-12-19 LAB — HEPATITIS C ANTIBODY: HCV AB: NEGATIVE

## 2015-12-19 LAB — RPR

## 2015-12-19 LAB — HEPATITIS B SURFACE ANTIGEN: Hepatitis B Surface Ag: NEGATIVE

## 2015-12-19 LAB — HIV ANTIBODY (ROUTINE TESTING W REFLEX): HIV 1&2 Ab, 4th Generation: NONREACTIVE

## 2015-12-20 ENCOUNTER — Telehealth: Payer: Self-pay

## 2015-12-20 NOTE — Telephone Encounter (Signed)
I [facility] S[facility] and was told [employer] covered 100% no prior auth.  Patient informed.

## 2015-12-27 ENCOUNTER — Encounter: Payer: Self-pay | Admitting: Gynecology

## 2015-12-27 ENCOUNTER — Ambulatory Visit (INDEPENDENT_AMBULATORY_CARE_PROVIDER_SITE_OTHER): Payer: BLUE CROSS/BLUE SHIELD | Admitting: Gynecology

## 2015-12-27 VITALS — BP 134/80 | Ht 66.0 in | Wt 251.0 lb

## 2015-12-27 DIAGNOSIS — Z3046 Encounter for surveillance of implantable subdermal contraceptive: Secondary | ICD-10-CM | POA: Diagnosis not present

## 2015-12-27 NOTE — Patient Instructions (Signed)
Intrauterine Device Information An intrauterine device (IUD) is inserted into your uterus to prevent pregnancy. There are two types of IUDs available:   Copper IUD--This type of IUD is wrapped in copper wire and is placed inside the uterus. Copper makes the uterus and fallopian tubes produce a fluid that kills sperm. The copper IUD can stay in place for 10 years.  Hormone IUD--This type of IUD contains the hormone progestin (synthetic progesterone). The hormone thickens the cervical mucus and prevents sperm from entering the uterus. It also thins the uterine lining to prevent implantation of a fertilized egg. The hormone can weaken or kill the sperm that get into the uterus. One type of hormone IUD can stay in place for 5 years, and another type can stay in place for 3 years. Your health care provider will make sure you are a good candidate for a contraceptive IUD. Discuss with your health care provider the possible side effects.  ADVANTAGES OF AN INTRAUTERINE DEVICE  IUDs are highly effective, reversible, long acting, and low maintenance.   There are no estrogen-related side effects.   An IUD can be used when breastfeeding.   IUDs are not associated with weight gain.   The copper IUD works immediately after insertion.   The hormone IUD works right away if inserted within 7 days of your period starting. You will need to use a backup method of birth control for 7 days if the hormone IUD is inserted at any other time in your cycle.  The copper IUD does not interfere with your female hormones.   The hormone IUD can make heavy menstrual periods lighter and decrease cramping.   The hormone IUD can be used for 3 or 5 years.   The copper IUD can be used for 10 years. DISADVANTAGES OF AN INTRAUTERINE DEVICE  The hormone IUD can be associated with irregular bleeding patterns.   The copper IUD can make your menstrual flow heavier and more painful.   You may experience cramping and  vaginal bleeding after insertion.    This information is not intended to replace advice given to you by your health care provider. Make sure you discuss any questions you have with your health care provider.   Document Released: 02/19/2004 Document Revised: 11/17/2012 Document Reviewed: 09/05/2012 Elsevier Interactive Patient Education 2016 Elsevier Inc.  

## 2015-12-27 NOTE — Progress Notes (Signed)
   Patient is a 25 year old who was seen in the office for her annual exam on September 17 please see previous note for details as her past medical history. She had requested a full STD screening since she had a new partner it was all negative. She's here today for removal of the Nexplanon since it has been causing her weight gain and headaches and she's going to return back next week for placement of a nonhormonal IUD such as the ParaGard T380A.  Procedure note:                                                      Removal of Implanon/Xplanon  The patient was in the supine position with her left arm at 90 identification of the previously placed Implanon was noted. The site for the incision was to be made Betadine solution was applied as an antiseptic. One percent lidocaine was infiltrated under the implant to keep a close to the skin surface. Countertraction on the proximal end of the implant was made and a longitudinal incision of 2 mm from the tip of the implant towards the elbow was made. Pushing the implant towards the incision was made until the tip was visible. The implant was then grasped with a forcep (hemostat) and gently the implant was removed. The implant was inspected and was 4 cm long shown to the patient and discarded. The incision Was then approximated with 3 interrupted sutures of 4-0 Vicryl suture. Neosporin and a pressure dressing was placed which she will remove tomorrow. We will remove her sutures next week when she comes back for placement of the ParaGard T380A IUD.    [facility] RichlandFERNANDEZ,Lanson Randle HMD12:00 PMTD@

## 2015-12-28 ENCOUNTER — Ambulatory Visit: Payer: BLUE CROSS/BLUE SHIELD | Admitting: Gynecology

## 2016-01-01 ENCOUNTER — Encounter: Payer: Self-pay | Admitting: Gynecology

## 2016-01-01 ENCOUNTER — Ambulatory Visit (INDEPENDENT_AMBULATORY_CARE_PROVIDER_SITE_OTHER): Payer: BLUE CROSS/BLUE SHIELD | Admitting: Gynecology

## 2016-01-01 VITALS — BP 122/80 | Ht 66.0 in | Wt 251.0 lb

## 2016-01-01 DIAGNOSIS — Z30430 Encounter for insertion of intrauterine contraceptive device: Secondary | ICD-10-CM | POA: Diagnosis not present

## 2016-01-01 DIAGNOSIS — Z975 Presence of (intrauterine) contraceptive device: Secondary | ICD-10-CM | POA: Insufficient documentation

## 2016-01-01 NOTE — Progress Notes (Signed)
   Patient is a 25 year old that presented to the office today for insertion of the ParaGard T380A IUD. Patient was seen in the office on 12/27/2015 whereby her Nexplanon had been removed and she was here to remove stitches from the incision site. She is changing her contraceptive method as a result of headaches and weight gain as she was experiencing with the Nexplanon since she had been on for several years.  Exam: Left upper arm medial aspect 3 sutures were removed Neosporin applied and a Band-Aid was placed over it.  Patient had previously been provided with literature information on the ParaGard T380A IUD. Patient has not been sexually active in over 4 weeks. Patient had been amenorrheic 7 result of the Nexplanon for several years.  Procedure note:                                                                    IUD procedure note       Patient presented to the office today for placement of ParaGard IUD. The patient had previously been provided with literature information on this method of contraception. The risks benefits and pros and cons were discussed and all her questions were answered. She is fully aware that this form of contraception is 99% effective and is good for 10 years.  Pelvic exam: Bartholin urethra Skene glands: Within normal limits Vagina: No lesions or discharge Cervix: No lesions or discharge Uterus: Anteverted position Adnexa: No masses or tenderness Rectal exam: Not done  The cervix was cleansed with Betadine solution. A single-tooth tenaculum was placed on the anterior cervical lip. The uterus sounded to 8 centimeter. The IUD was shown to the patient and inserted in a sterile fashion. The IUD string was trimmed. The single-tooth tenaculum was removed. Patient was instructed to return back to the office in one month for follow up.

## 2016-01-01 NOTE — Patient Instructions (Signed)

## 2016-01-02 ENCOUNTER — Encounter: Payer: Self-pay | Admitting: Anesthesiology

## 2016-01-29 ENCOUNTER — Ambulatory Visit: Payer: BLUE CROSS/BLUE SHIELD | Admitting: Gynecology

## 2016-02-07 ENCOUNTER — Ambulatory Visit: Payer: BLUE CROSS/BLUE SHIELD | Admitting: Gynecology

## 2016-02-07 DIAGNOSIS — Z0289 Encounter for other administrative examinations: Secondary | ICD-10-CM

## 2016-07-15 ENCOUNTER — Ambulatory Visit: Payer: BLUE CROSS/BLUE SHIELD | Admitting: Gynecology

## 2016-08-08 ENCOUNTER — Ambulatory Visit: Payer: BLUE CROSS/BLUE SHIELD | Admitting: Gynecology

## 2016-08-13 ENCOUNTER — Encounter: Payer: Self-pay | Admitting: Gynecology

## 2016-08-29 DIAGNOSIS — A549 Gonococcal infection, unspecified: Secondary | ICD-10-CM

## 2016-08-29 HISTORY — DX: Gonococcal infection, unspecified: A54.9

## 2016-09-01 ENCOUNTER — Ambulatory Visit (INDEPENDENT_AMBULATORY_CARE_PROVIDER_SITE_OTHER): Payer: BLUE CROSS/BLUE SHIELD

## 2016-09-01 ENCOUNTER — Ambulatory Visit (INDEPENDENT_AMBULATORY_CARE_PROVIDER_SITE_OTHER): Payer: BLUE CROSS/BLUE SHIELD | Admitting: Gynecology

## 2016-09-01 ENCOUNTER — Encounter: Payer: Self-pay | Admitting: Gynecology

## 2016-09-01 ENCOUNTER — Other Ambulatory Visit: Payer: Self-pay | Admitting: Gynecology

## 2016-09-01 VITALS — BP 122/84 | Wt 244.4 lb

## 2016-09-01 DIAGNOSIS — N912 Amenorrhea, unspecified: Secondary | ICD-10-CM

## 2016-09-01 DIAGNOSIS — Z30432 Encounter for removal of intrauterine contraceptive device: Secondary | ICD-10-CM

## 2016-09-01 DIAGNOSIS — R102 Pelvic and perineal pain: Secondary | ICD-10-CM

## 2016-09-01 DIAGNOSIS — A599 Trichomoniasis, unspecified: Secondary | ICD-10-CM | POA: Diagnosis not present

## 2016-09-01 DIAGNOSIS — T8332XA Displacement of intrauterine contraceptive device, initial encounter: Secondary | ICD-10-CM | POA: Diagnosis not present

## 2016-09-01 DIAGNOSIS — Z113 Encounter for screening for infections with a predominantly sexual mode of transmission: Secondary | ICD-10-CM

## 2016-09-01 DIAGNOSIS — N898 Other specified noninflammatory disorders of vagina: Secondary | ICD-10-CM | POA: Diagnosis not present

## 2016-09-01 LAB — WET PREP FOR TRICH, YEAST, CLUE: Yeast Wet Prep HPF POC: NONE SEEN

## 2016-09-01 LAB — PREGNANCY, URINE: Preg Test, Ur: NEGATIVE

## 2016-09-01 LAB — TSH: TSH: 1.01 mIU/L

## 2016-09-01 MED ORDER — TINIDAZOLE 500 MG PO TABS: ORAL_TABLET | ORAL | 0 refills | Status: DC

## 2016-09-01 MED ORDER — MEDROXYPROGESTERONE ACETATE 10 MG PO TABS: ORAL_TABLET | ORAL | 4 refills | Status: DC

## 2016-09-01 NOTE — Addendum Note (Signed)
Addended by: Lear NgMARTIN, MISTY L on: 09/01/2016 02:10 PM   Modules accepted: Orders

## 2016-09-01 NOTE — Progress Notes (Signed)
   Patient is a 26 year old that presented to the office for several complaints. First complaint is she has not had a menstrual cycle since February of this year. Review of her record indicated that she was placed on a nonhormonal IUD in October 2017. She reports normal menstrual cycle in November December January and February and none since then. She's had some nausea but no unusual headaches or blurred vision or nipple discharge. She's. States that one time after the IUD was placed that she had intercourse without using any form of contraception in addition such as condoms. She was seen for her annual exam in September of last year she recent had a new sexual partner. Patient has had history of multiple STDs in the past. She was treated for chlamydia infection in August 2016. Her Pap smear was normal at that time. She has had a pretty extensive Pap smear/dysplasia history see previous note dated September 2017 for details.  Patient also was experiencing low abdominal discomfort on and off with her without intercourse.  Urine pregnancy test was negative  Exam: Gen. appearance well-developed 1 nourished female with the above-mentioned complaints Back: No CVA tenderness Abdomen: Soft nontender no rebound or guarding Pelvic: Bartholin urethra Skene was within normal limits Vagina friable vaginal mucosa fishy odor discharge noted Cervix: IUD string visualized Bimanual exam uterus upper limits of normal nontender no rebound or guarding Adnexa: No palpable masses or tenderness although somewhat limited because of patient's abdominal girth  Wet prep: Moderate Trichomonas, many clue cells, many white blood cell, too numerous to count bacteria  Ultrasound today: Uterus measured 9.1 x 4.4 x 3.7 cm endometrial stripe 5.1 mm. IUD was seen in the lower uterine segment near the cervix. Right and left ovary normal. Uterus slightly deviated to the right. Some fluid in the cul-de-sac was noted. No apparent  adnexal masses.  Assessment/plan: #1 clinical evidence of trichomoniasis patient will be treated with Tindamax 500 mg tablets. She will take 4 tablets today and repeat in 24 hours. A GC and Chlamydia culture was obtained today. As part of her STD screen and HIV, RPR, hepatitis B and C will be obtained today as well. Patient to contact her partner to be screened and treated accordingly. Patient to return back in 2 weeks for test of cure #2 amenorrhea negative urine pregnancy tests we'll check a TSH and prolactin level today. If normal she will take Provera 10 mg 1 by mouth daily for 10 days of each month if she does not have a spontaneous menses every 30 days and if home pregnancy test is negative. #3 follow-up abdominal pelvic discomfort attributed to the IUD sitting in the lower uterine segment near the cervix so after cleaning cervix with Betadine solution the IUD string was retrieved shown to the patient discarded

## 2016-09-01 NOTE — Patient Instructions (Signed)
Tinidazole tablets What is this medicine? TINIDAZOLE (tye NI da zole) is an antiinfective. It is used to treat amebiasis, giardiasis, trichomoniasis, and vaginosis. It will not work for colds, flu, or other viral infections. This medicine may be used for other purposes; ask your health care provider or pharmacist if you have questions. COMMON BRAND NAME(S): Tindamax What should I tell my health care provider before I take this medicine? They need to know if you have any of these conditions: -anemia or other blood disorders -if you frequently drink alcohol containing drinks -receiving hemodialysis -seizure disorder -an unusual or allergic reaction to tinidazole, other medicines, foods, dyes, or preservatives -pregnant or trying to get pregnant -breast-feeding How should I use this medicine? Take this medicine by mouth with a full glass of water. Follow the directions on the prescription label. Take with food. Take your medicine at regular intervals. Do not take your medicine more often than directed. Take all of your medicine as directed even if you think you are better. Do not skip doses or stop your medicine early. Talk to your pediatrician regarding the use of this medicine in children. While this drug may be prescribed for children as young as 3 years of age for selected conditions, precautions do apply. Overdosage: If you think you have taken too much of this medicine contact a poison control center or emergency room at once. NOTE: This medicine is only for you. Do not share this medicine with others. What if I miss a dose? If you miss a dose, take it as soon as you can. If it is almost time for your next dose, take only that dose. Do not take double or extra doses. What may interact with this medicine? Do not take this medicine with any of the following medications: -alcohol or any product that contains alcohol -amprenavir oral solution -disulfiram -paclitaxel injection -ritonavir  oral solution -sertraline oral solution -sulfamethoxazole-trimethoprim injection This medicine may also interact with the following medications: -cholestyramine -cimetidine -conivaptan -cyclosporin -fluorouracil -fosphenytoin, phenytoin -ketoconazole -lithium -phenobarbital -tacrolimus -warfarin This list may not describe all possible interactions. Give your health care provider a list of all the medicines, herbs, non-prescription drugs, or dietary supplements you use. Also tell them if you smoke, drink alcohol, or use illegal drugs. Some items may interact with your medicine. What should I watch for while using this medicine? Tell your doctor or health care professional if your symptoms do not improve or if they get worse. Avoid alcoholic drinks while you are taking this medicine and for three days afterward. Alcohol may make you feel dizzy, sick, or flushed. If you are being treated for a sexually transmitted disease, avoid sexual contact until you have finished your treatment. Your sexual partner may also need treatment. What side effects may I notice from receiving this medicine? Side effects that you should report to your doctor or health care professional as soon as possible: -allergic reactions like skin rash, itching or hives, swelling of the face, lips, or tongue -breathing problems -confusion, depression -dark or white patches in the mouth -feeling faint or lightheaded, falls -fever, infection -numbness, tingling, pain or weakness in the hands or feet -pain when passing urine -seizures -unusually weak or tired -vaginal irritation or discharge -vomiting Side effects that usually do not require medical attention (report to your doctor or health care professional if they continue or are bothersome): -dark brown or reddish urine -diarrhea -headache -loss of appetite -metallic taste -nausea -stomach upset This list may not describe all   possible side effects. Call your  doctor for medical advice about side effects. You may report side effects to FDA at 1-800-FDA-1088. Where should I keep my medicine? Keep out of the reach of children. Store at room temperature between 15 and 30 degrees C (59 and 86 degrees F). Protect from light and moisture. Keep container tightly closed. Throw away any unused medicine after the expiration date. NOTE: This sheet is a summary. It may not cover all possible information. If you have questions about this medicine, talk to your doctor, pharmacist, or health care provider.  2018 Elsevier/Gold Standard (2007-12-13 15:22:28) Cervicitis Cervicitis is irritation and swelling of the cervix. The cervix is the lower and narrow end of the uterus. It is the part of the uterus that opens up to the vagina. What are the causes? This condition may be caused by:  An STI (sexually transmitted infection), such as gonorrhea, chlamydia, or genital herpes.  Objects that are put in the vagina, such as tampons or birth control devices. This usually occurs if an object is left in for too long.  Chemical irritation or allergic reaction. This may be from vaginal douches, latex condoms, or contraceptive creams.  An injury to the cervix.  A bacterial infection.  Radiation therapy.  What increases the risk? You are more likely to develop this condition if:  You have unprotected sex.  You have sex with many partners.  You have a new sexual partner.  You start having sex at an early age.  You have a history of STIs.  What are the signs or symptoms? Symptoms of this condition include:  Wallace CullensGray, white, yellow, or bad-smelling vaginal discharge.  Pain or itchiness around the vagina.  Pain during sex.  Pain in the lower abdomen or lower back, especially during sex.  Urinating often.  Pain during urination.  Abnormal vaginal bleeding, such as bleeding between periods, after sex, or after menopause.  In some cases, there are no  symptoms. How is this diagnosed? This condition may be diagnosed with:  A pelvic exam. Your health care provider will examine whether the cervix has an unusual discharge or bleeds easily when touched with a swab.  A wet prep. This is a test in which vaginal discharge is examined under a microscope to check for signs of infection.  A swab test of the cervix. For this test, sample cells from the cervix are collected on a swab and examined under a microscope to check for signs of infection.  Urine tests.  How is this treated? Treatment for cervicitis depends on what is causing the condition. Treatment may include:  Antibiotic medicines. These are used to treat certain infections, including STIs like gonorrhea or chlamydia. If you are taking these medicines to treat an STI, your sexual partner may also need to take these medicines.  Antiviral medicines. These are used to treat herpes simplex virus. Your sexual partner may also need to take these medicines.  Stopping use of items that cause irritation, such as tampons, latex condoms, douches, or spermicides.  Follow these instructions at home:  Do not have sex until your health care provider says it is okay.  Take over-the-counter and prescription medicines only as told by your health care provider.  If you were prescribed an antibiotic, take it as told by your health care provider. Do not stop taking the antibiotic even if you start to feel better.  Keep all follow-up visits as told by your health care provider. This is important. Contact  a health care provider if:  Your symptoms come back or get worse after treatment.  You have a fever.  You have fatigue.  You have pain in your abdomen.  You experience nausea, vomiting, or diarrhea.  You have back pain. Get help right away if:  You have severe abdominal pain that cannot be helped with medicine.  You cannot urinate. Summary  Cervicitis is irritation and swelling of the  cervix.  This condition may be caused by an STI (sexually transmitted infection), an allergic reaction or chemical irritation, radiation therapy, or objects that are put in the vagina, such as tampons or diaphragms.  Symptoms of this condition can include unusual vaginal discharge, painful urination, irritation or pain around the vagina, bleeding between periods or after sex, and pain during sex.  You are more likely to develop this condition if you have unprotected sex, have many sexual partners, or have a history of STIs.  This condition may be treated with antibiotic or antiviral medicines or by stopping use of items that cause irritation. This information is not intended to replace advice given to you by your health care provider. Make sure you discuss any questions you have with your health care provider. Document Released: 03/17/2005 Document Revised: 12/01/2015 Document Reviewed: 12/01/2015 Elsevier Interactive Patient Education  2017 [employer].

## 2016-09-02 ENCOUNTER — Encounter: Payer: Self-pay | Admitting: Gynecology

## 2016-09-02 ENCOUNTER — Other Ambulatory Visit: Payer: Self-pay | Admitting: Gynecology

## 2016-09-02 LAB — HEPATITIS C ANTIBODY: HCV Ab: NEGATIVE

## 2016-09-02 LAB — PROLACTIN: PROLACTIN: 7.7 ng/mL

## 2016-09-02 LAB — RPR

## 2016-09-02 LAB — GC/CHLAMYDIA PROBE AMP
CT Probe RNA: NOT DETECTED
GC Probe RNA: DETECTED — AB

## 2016-09-02 LAB — HEPATITIS B SURFACE ANTIGEN: Hepatitis B Surface Ag: NEGATIVE

## 2016-09-02 LAB — HIV ANTIBODY (ROUTINE TESTING W REFLEX): HIV 1&2 Ab, 4th Generation: NONREACTIVE

## 2016-09-02 MED ORDER — ERYTHROMYCIN BASE 500 MG PO TABS: 1000.0000 mg | ORAL_TABLET | Freq: Once | ORAL | 0 refills | Status: AC

## 2016-09-02 MED ORDER — CEFTRIAXONE SODIUM 250 MG IJ SOLR: 250.0000 mg | Freq: Once | INTRAMUSCULAR | Status: AC

## 2016-09-04 ENCOUNTER — Inpatient Hospital Stay (HOSPITAL_COMMUNITY)
Admission: AD | Admit: 2016-09-04 | Discharge: 2016-09-04 | Disposition: A | Discharge status: Home / Self Care | Payer: BLUE CROSS/BLUE SHIELD | Source: Ambulatory Visit | Attending: Obstetrics and Gynecology | Admitting: Obstetrics and Gynecology

## 2016-09-04 ENCOUNTER — Telehealth: Payer: Self-pay

## 2016-09-04 DIAGNOSIS — A64 Unspecified sexually transmitted disease: Secondary | ICD-10-CM | POA: Diagnosis not present

## 2016-09-04 MED ORDER — AZITHROMYCIN 500 MG PO TABS: 1000.0000 mg | ORAL_TABLET | Freq: Once | ORAL | 0 refills | Status: AC

## 2016-09-04 MED ORDER — CEFTRIAXONE SODIUM 250 MG IJ SOLR
250.0000 mg | Freq: Once | INTRAMUSCULAR | Status: AC
  Administered 2016-09-04: 250 mg via INTRAMUSCULAR
  Filled 2016-09-04: qty 250

## 2016-09-04 NOTE — [facility] Note (Signed)
Pt sent for gonereah treatment.

## 2016-09-04 NOTE — Telephone Encounter (Signed)
[facility] regarding you prescribed Erythromycin  1 gram for patient to take po for pos GC.  She said that Erythromycin is not on the recommended drug treatment list for [facility] [place] for [place] GC.  She said the recommended Rx is Azithromycin 1 gram po once.   Ok to contact patient and re-prescribed Azithryomycin for her?

## 2016-09-04 NOTE — Telephone Encounter (Signed)
Past for I had recommended azithromycin 1 g by mouth in addition to this ceftriaxone injection that she received I believe last night in [facility]

## 2016-09-04 NOTE — Telephone Encounter (Signed)
Patient informed. [facility].  She was advised I cancelled Emycin with pharmacy and she will need Azithromycin. Rx sent. Brandy notified of change in Rx.

## 2016-09-18 ENCOUNTER — Ambulatory Visit: Payer: BLUE CROSS/BLUE SHIELD | Admitting: Gynecology

## 2016-09-18 DIAGNOSIS — Z0289 Encounter for other administrative examinations: Secondary | ICD-10-CM

## 2016-12-18 ENCOUNTER — Encounter: Payer: BLUE CROSS/BLUE SHIELD | Admitting: Obstetrics & Gynecology

## 2016-12-18 ENCOUNTER — Encounter: Payer: BLUE CROSS/BLUE SHIELD | Admitting: Gynecology

## 2016-12-18 DIAGNOSIS — Z0289 Encounter for other administrative examinations: Secondary | ICD-10-CM

## 2017-02-11 ENCOUNTER — Encounter (HOSPITAL_BASED_OUTPATIENT_CLINIC_OR_DEPARTMENT_OTHER): Payer: Self-pay

## 2017-02-11 ENCOUNTER — Emergency Department (HOSPITAL_BASED_OUTPATIENT_CLINIC_OR_DEPARTMENT_OTHER): Payer: BLUE CROSS/BLUE SHIELD

## 2017-02-11 ENCOUNTER — Emergency Department (HOSPITAL_BASED_OUTPATIENT_CLINIC_OR_DEPARTMENT_OTHER)
Admission: EM | Admit: 2017-02-11 | Discharge: 2017-02-11 | Disposition: A | Discharge status: Home / Self Care | Payer: BLUE CROSS/BLUE SHIELD | Attending: Emergency Medicine | Admitting: Emergency Medicine

## 2017-02-11 DIAGNOSIS — Z79899 Other long term (current) drug therapy: Secondary | ICD-10-CM | POA: Diagnosis not present

## 2017-02-11 DIAGNOSIS — G43711 Chronic migraine without aura, intractable, with status migrainosus: Secondary | ICD-10-CM | POA: Insufficient documentation

## 2017-02-11 DIAGNOSIS — I10 Essential (primary) hypertension: Secondary | ICD-10-CM | POA: Diagnosis not present

## 2017-02-11 DIAGNOSIS — R51 Headache: Secondary | ICD-10-CM | POA: Diagnosis present

## 2017-02-11 MED ORDER — DEXAMETHASONE SODIUM PHOSPHATE 10 MG/ML IJ SOLN
10.0000 mg | Freq: Once | INTRAMUSCULAR | Status: AC
  Administered 2017-02-11: 10 mg via INTRAVENOUS
  Filled 2017-02-11: qty 1

## 2017-02-11 MED ORDER — SODIUM CHLORIDE 0.9 % IV BOLUS (SEPSIS)
1000.0000 mL | Freq: Once | INTRAVENOUS | Status: AC
  Administered 2017-02-11: 1000 mL via INTRAVENOUS

## 2017-02-11 MED ORDER — SODIUM CHLORIDE 0.9 % IV SOLN: INTRAVENOUS | Status: DC

## 2017-02-11 MED ORDER — PROMETHAZINE HCL 25 MG/ML IJ SOLN
12.5000 mg | Freq: Once | INTRAMUSCULAR | Status: AC
  Administered 2017-02-11: 12.5 mg via INTRAVENOUS
  Filled 2017-02-11: qty 1

## 2017-02-11 MED ORDER — DIPHENHYDRAMINE HCL 25 MG PO CAPS
50.0000 mg | ORAL_CAPSULE | Freq: Once | ORAL | Status: AC
  Administered 2017-02-11: 50 mg via ORAL
  Filled 2017-02-11: qty 2

## 2017-02-11 NOTE — ED Triage Notes (Addendum)
Pt reports migraine with N/V x 4 times starting this morning when she woke up. Pt states she had a dream that she was hit in the head last night and pt woke up with migraine. Pt reports migraine is light- and sound-sensitive. Rates pain 6/10. Pt reports she had a head CT in middle school for frequent headaches; pt reports they are back and frequent like they were then. Pt requests having another head CT. Does not have a neurologist.

## 2017-02-11 NOTE — Discharge Instructions (Signed)
Rest at home.  Keep your appointment to follow-up with neurology.  CT scan of the head today without any acute findings work note provided.

## 2017-02-11 NOTE — ED Provider Notes (Signed)
[facility] EMERGENCY DEPARTMENT Provider Note   CSN: [id number] Arrival date & time: 02/11/17  5784     History   Chief Complaint Chief Complaint  Patient presents with  . Migraine    HPI Sara Benson is a 26 y.o. female.  Patient with history of chronic headaches.  Has at least 3 severe headaches a month.  Symptoms started when she was in middle school.  Was thought to have a migraine type component headache then.  No formal evaluation recently.  The patient has been referred to neurology by her primary care doctor for evaluation.  Patient does have a history of hypertension.  Is taking her medicines.  The headache pain is behind both eyes.  Associated with nausea and vomiting.  Associated with light sensitivity and sound sensitivity.  Patient states the pain is 6 out of 10.  Patient is requesting CT of head.  Since these headaches have been quite persistent over the last several days.      Past Medical History:  Diagnosis Date  . Cervical dysplasia   . Chlamydia 2016  . Gonorrhea 08/2016  . 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 Hypertension   . Normal spontaneous vaginal delivery   . Trichimoniasis     Patient Active Problem List   Diagnosis Date Noted  . Trichomonas vaginalis infection 09/01/2016  . History of trichomoniasis 09/14/2012  . CIN II (cervical intraepithelial neoplasia II) 02/19/2012  . GC (gonococcus infection) 12/12/2011  . Chlamydia infection 12/12/2011  . CIN III (cervical intraepithelial neoplasia grade III) with severe dysplasia 12/12/2011    Past Surgical History:  Procedure Laterality Date  . TONSILLECTOMY AND ADENOIDECTOMY  AGE 89 (2010)    OB History    Gravida Para Term Preterm AB Living   1 1       1    SAB TAB Ectopic Multiple Live Births                   Home Medications    Prior to Admission medications   Medication Sig Start Date End Date Taking? Authorizing Provider  hydrochlorothiazide (HYDRODIURIL)  25 MG tablet Take 25 mg by mouth daily. 12/27/15 02/11/17 Yes [provider]  acetaminophen (TYLENOL) 325 MG tablet Take 650 mg by mouth every 6 (six) hours as needed.    [provider]  aspirin-acetaminophen-caffeine (EXCEDRIN MIGRAINE) [phone] MG per tablet Take by mouth every 6 (six) hours as needed for headache.    [provider]  medroxyPROGESTERone (PROVERA) 10 MG tablet Take 1 tablet 10 days of the month if no spontaneous menses every 30 days. Make sure home pregnancy test is negative first 09/01/16   Ok Edwards, MD  tinidazole [facility]) 500 MG tablet Take four tablets today and four tablets tomorrow at the same time 09/01/16   Ok Edwards, MD    Family History Family History  Problem Relation Age of Onset  . Hypertension Mother   . Cancer Maternal Grandfather        BLADDER  . Cancer Paternal Grandfather        PROSTATE    Social History Social History   Tobacco Use  . Smoking status: Never Smoker  . Smokeless tobacco: Never Used  Substance Use Topics  . Alcohol use: No    Alcohol/week: 0.0 oz  . Drug use: No     Allergies   Patient has no known allergies.   Review of Systems Review of Systems  Constitutional: Negative for  fever.  HENT: Negative for congestion.   Eyes: Positive for photophobia.  Cardiovascular: Negative for chest pain.  Gastrointestinal: Positive for nausea and vomiting. Negative for abdominal pain.  Genitourinary: Negative for dysuria.  Musculoskeletal: Negative for neck stiffness.  Skin: Negative for rash.  Neurological: Positive for headaches.  Hematological: Does not bruise/bleed easily.  Psychiatric/Behavioral: Negative for confusion.     Physical Exam Updated Vital Signs BP (!) 149/108 (BP Location: Left Arm)   Pulse 65   Temp 98 F (36.7 C) (Oral)   Resp 16   Ht 1.676 m (5\' 6" )   Wt 108.9 kg (240 lb)   LMP 11/28/2016   SpO2 100%   BMI 38.74 kg/m   Physical Exam  Constitutional:  She is oriented to person, place, and time. She appears well-developed and well-nourished. No distress.  HENT:  Head: Atraumatic.  Mouth/Throat: Oropharynx is clear and moist.  Eyes: Conjunctivae and EOM are normal. Pupils are equal, round, and reactive to light.  Neck: Normal range of motion. Neck supple.  Cardiovascular: Normal rate, regular rhythm and normal heart sounds.  Pulmonary/Chest: Effort normal. No respiratory distress.  Abdominal: Soft.  Musculoskeletal: Normal range of motion.  Neurological: She is alert and oriented to person, place, and time. No cranial nerve deficit or sensory deficit. She exhibits normal muscle tone. Coordination normal.  Skin: Skin is warm. No rash noted.  Nursing note and vitals reviewed.    ED Treatments / Results  Labs (all labs ordered are listed, but only abnormal results are displayed) Labs Reviewed - No data to display  EKG  EKG Interpretation None       Radiology Ct Head Wo Contrast  Result Date: 02/11/2017 CLINICAL DATA:  Acute onset severe headache with nausea and vomiting EXAM: CT HEAD WITHOUT CONTRAST TECHNIQUE: Contiguous axial images were obtained from the base of the skull through the vertex without intravenous contrast. COMPARISON:  None. FINDINGS: Brain: The ventricles are normal in size and configuration. There is no intracranial mass, hemorrhage, extra-axial fluid collection, or midline shift. Gray-white compartments appear normal. No acute infarct evident. Vascular: No hyperdense vessel.  No abnormal calcification evident. Skull: Bony calvarium appears intact. Sinuses/Orbits: Visualized paranasal sinuses are clear. Visualized orbits appear symmetric bilaterally. Other: Mastoid air cells are clear. IMPRESSION: Study within normal limits. Electronically Signed   By: Bretta BangWilliam  Woodruff III M.D.   On: 02/11/2017 08:22    Procedures Procedures (including critical care time)  Medications Ordered in ED Medications  0.9 %  sodium  chloride infusion ( Intravenous Hold 02/11/17 0932)  sodium chloride 0.9 % bolus 1,000 mL (1,000 mLs Intravenous New Bag/Given 02/11/17 0833)  promethazine (PHENERGAN) injection 12.5 mg (12.5 mg Intravenous Given 02/11/17 0835)  dexamethasone (DECADRON) injection 10 mg (10 mg Intravenous Given 02/11/17 0834)  diphenhydrAMINE (BENADRYL) capsule 50 mg (50 mg Oral Given 02/11/17 [id number])     Initial Impression / Assessment and Plan / ED Course  I have reviewed the triage vital signs and the nursing notes.  Pertinent labs & imaging results that were available during my care of the patient were reviewed by me and considered in my medical decision making (see chart for details).    Historically symptoms seem to be consistent with a migraine component.  But since the pain is behind the eyes wanted to rule out any sinus abnormalities.  No fall or trauma specifically.  But CT head was done and was negative.  Patient treated with migraine cocktail with significant improvement.  Patient has follow-up  with neurology.  Currently nontoxic no acute distress.  Patient also will need follow-up for her blood pressure by primary care provider.   Final Clinical Impressions(s) / ED Diagnoses   Final diagnoses:  Intractable chronic migraine without aura and with status migrainosus    ED Discharge Orders    None       Vanetta MuldersZackowski, Elleigh Cassetta, MD 02/11/17 [phone]

## 2017-03-06 ENCOUNTER — Ambulatory Visit (INDEPENDENT_AMBULATORY_CARE_PROVIDER_SITE_OTHER): Payer: BLUE CROSS/BLUE SHIELD | Admitting: Neurology

## 2017-03-06 ENCOUNTER — Encounter: Payer: Self-pay | Admitting: Neurology

## 2017-03-06 VITALS — BP 168/107 | HR 67 | Ht 66.0 in | Wt 239.4 lb

## 2017-03-06 DIAGNOSIS — G43709 Chronic migraine without aura, not intractable, without status migrainosus: Secondary | ICD-10-CM

## 2017-03-06 DIAGNOSIS — H05119 Granuloma of unspecified orbit: Secondary | ICD-10-CM

## 2017-03-06 DIAGNOSIS — R51 Headache with orthostatic component, not elsewhere classified: Secondary | ICD-10-CM

## 2017-03-06 DIAGNOSIS — H5713 Ocular pain, bilateral: Secondary | ICD-10-CM

## 2017-03-06 DIAGNOSIS — R519 Headache, unspecified: Secondary | ICD-10-CM

## 2017-03-06 DIAGNOSIS — G4440 Drug-induced headache, not elsewhere classified, not intractable: Secondary | ICD-10-CM | POA: Diagnosis not present

## 2017-03-06 DIAGNOSIS — T3995XA Adverse effect of unspecified nonopioid analgesic, antipyretic and antirheumatic, initial encounter: Secondary | ICD-10-CM

## 2017-03-06 DIAGNOSIS — R0683 Snoring: Secondary | ICD-10-CM

## 2017-03-06 DIAGNOSIS — G8929 Other chronic pain: Secondary | ICD-10-CM

## 2017-03-06 DIAGNOSIS — IMO0002 Reserved for concepts with insufficient information to code with codable children: Secondary | ICD-10-CM

## 2017-03-06 MED ORDER — TOPIRAMATE ER 100 MG PO CAP24: 100.0000 mg | ORAL_CAPSULE | Freq: Every day | ORAL | 11 refills | Status: DC

## 2017-03-06 MED ORDER — RIZATRIPTAN BENZOATE 10 MG PO TABS: 10.0000 mg | ORAL_TABLET | ORAL | 11 refills | Status: DC | PRN

## 2017-03-06 MED ORDER — ONDANSETRON HCL 4 MG PO TABS: 4.0000 mg | ORAL_TABLET | Freq: Three times a day (TID) | ORAL | 11 refills | Status: DC | PRN

## 2017-03-06 NOTE — Patient Instructions (Addendum)
As far as your medications are concerned, I would like to suggest: - Stop daily over the counter med use (Tylenol, excedrin, ibuprofen, alleve) Do not tak emore than 2-3x in one week  -At onset of Migraine Rizatriptan and zofran. Please take one tablet at the onset of your headache. If it does not improve the symptoms please take one additional tablet. Do not take more then 2 tablets in 24hrs. Do not take use more then 2 to 3 days in a week.  - Trokendi week one 25mg , week two 50mg  week three 100mg . Takes 4-6 weeks for medications to work.  As far as diagnostic testing: If headaches persist need MRI brain and orbits.   Sleep evaluation for obstructive sleep apnea. We will call you  Rizatriptan tablets What is this medicine? RIZATRIPTAN (rye za TRIP tan) is used to treat migraines with or without aura. An aura is a strange feeling or visual disturbance that warns you of an attack. It is not used to prevent migraines. This medicine may be used for other purposes; ask your health care provider or pharmacist if you have questions. COMMON BRAND NAME(S): Maxalt What should I tell my health care provider before I take this medicine? They need to know if you have any of these conditions: -bowel disease or colitis -diabetes -family history of heart disease -fast or irregular heart beat -heart or blood vessel disease, angina (chest pain), or previous heart attack -high blood pressure -high cholesterol -history of stroke, transient ischemic attacks (TIAs or mini-strokes), or intracranial bleeding -kidney or liver disease -overweight -poor circulation -postmenopausal or surgical removal of uterus and ovaries -Raynaud's disease -seizure disorder -an unusual or allergic reaction to rizatriptan, other medicines, foods, dyes, or preservatives -pregnant or trying to get pregnant -breast-feeding How should I use this medicine? This medicine is taken by mouth with a glass of water. Follow the  directions on the prescription label. This medicine is taken at the first symptoms of a migraine. It is not for everyday use. If your migraine headache returns after one dose, you can take another dose as directed. You must leave at least 2 hours between doses, and do not take more than 30 mg total in 24 hours. If there is no improvement at all after the first dose, do not take a second dose without talking to your doctor or health care professional. Do not take your medicine more often than directed. Talk to your pediatrician regarding the use of this medicine in children. While this drug may be prescribed for children as young as 6 years for selected conditions, precautions do apply. Overdosage: If you think you have taken too much of this medicine contact a poison control center or emergency room at once. NOTE: This medicine is only for you. Do not share this medicine with others. What if I miss a dose? This does not apply; this medicine is not for regular use. What may interact with this medicine? Do not take this medicine with any of the following medicines: -amphetamine, dextroamphetamine or cocaine -dihydroergotamine, ergotamine, ergoloid mesylates, methysergide, or ergot-type medication - do not take within 24 hours of taking rizatriptan -feverfew -MAOIs like Carbex, Eldepryl, Marplan, Nardil, and Parnate - do not take rizatriptan within 2 weeks of stopping MAOI therapy. -other migraine medicines like almotriptan, eletriptan, naratriptan, sumatriptan, zolmitriptan - do not take within 24 hours of taking rizatriptan -tryptophan This medicine may also interact with the following medications: -medicines for mental depression, anxiety or mood problems -propranolol This list  may not describe all possible interactions. Give your health care provider a list of all the medicines, herbs, non-prescription drugs, or dietary supplements you use. Also tell them if you smoke, drink alcohol, or use illegal  drugs. Some items may interact with your medicine. What should I watch for while using this medicine? Only take this medicine for a migraine headache. Take it if you get warning symptoms or at the start of a migraine attack. It is not for regular use to prevent migraine attacks. You may get drowsy or dizzy. Do not drive, use machinery, or do anything that needs mental alertness until you know how this medicine affects you. To reduce dizzy or fainting spells, do not sit or stand up quickly, especially if you are an older patient. Alcohol can increase drowsiness, dizziness and flushing. Avoid alcoholic drinks. Smoking cigarettes may increase the risk of heart-related side effects from using this medicine. If you take migraine medicines for 10 or more days a month, your migraines may get worse. Keep a diary of headache days and medicine use. Contact your healthcare professional if your migraine attacks occur more frequently. What side effects may I notice from receiving this medicine? Side effects that you should report to your doctor or health care professional as soon as possible: -allergic reactions like skin rash, itching or hives, swelling of the face, lips, or tongue -fast, slow, or irregular heart beat -increased or decreased blood pressure -seizures -severe stomach pain and cramping, bloody diarrhea -signs and symptoms of a blood clot such as breathing problems; changes in vision; chest pain; severe, sudden headache; pain, swelling, warmth in the leg; trouble speaking; sudden numbness or weakness of the face, arm or leg -tingling, pain, or numbness in the face, hands, or feet Side effects that usually do not require medical attention (report to your doctor or health care professional if they continue or are bothersome): -drowsiness -dry mouth -feeling warm, flushing, or redness of the face -headache -muscle cramps, pain -nausea, vomiting -unusually weak or tired This list may not describe  all possible side effects. Call your doctor for medical advice about side effects. You may report side effects to FDA at 1-800-FDA-1088. Where should I keep my medicine? Keep out of the reach of children. Store at room temperature between 15 and 30 degrees C (59 and 86 degrees F). Keep container tightly closed. Throw away any unused medicine after the expiration date. NOTE: This sheet is a summary. It may not cover all possible information. If you have questions about this medicine, talk to your doctor, pharmacist, or health care provider.  2018 Elsevier/Gold Standard (2012-11-16 10:16:39)   Ondansetron tablets What is this medicine? ONDANSETRON (on DAN se tron) is used to treat nausea and vomiting caused by chemotherapy. It is also used to prevent or treat nausea and vomiting after surgery. This medicine may be used for other purposes; ask your health care provider or pharmacist if you have questions. COMMON BRAND NAME(S): Zofran What should I tell my health care provider before I take this medicine? They need to know if you have any of these conditions: -heart disease -history of irregular heartbeat -liver disease -low levels of magnesium or potassium in the blood -an unusual or allergic reaction to ondansetron, granisetron, other medicines, foods, dyes, or preservatives -pregnant or trying to get pregnant -breast-feeding How should I use this medicine? Take this medicine by mouth with a glass of water. Follow the directions on your prescription label. Take your doses at regular intervals.  Do not take your medicine more often than directed. Talk to your pediatrician regarding the use of this medicine in children. Special care may be needed. Overdosage: If you think you have taken too much of this medicine contact a poison control center or emergency room at once. NOTE: This medicine is only for you. Do not share this medicine with others. What if I miss a dose? If you miss a dose, take  it as soon as you can. If it is almost time for your next dose, take only that dose. Do not take double or extra doses. What may interact with this medicine? Do not take this medicine with any of the following medications: -apomorphine -certain medicines for fungal infections like fluconazole, itraconazole, ketoconazole, posaconazole, voriconazole -cisapride -dofetilide -dronedarone -pimozide -thioridazine -ziprasidone This medicine may also interact with the following medications: -carbamazepine -certain medicines for depression, anxiety, or psychotic disturbances -fentanyl -linezolid -MAOIs like Carbex, Eldepryl, Marplan, Nardil, and Parnate -methylene blue (injected into a vein) -other medicines that prolong the QT interval (cause an abnormal heart rhythm) -phenytoin -rifampicin -tramadol This list may not describe all possible interactions. Give your health care provider a list of all the medicines, herbs, non-prescription drugs, or dietary supplements you use. Also tell them if you smoke, drink alcohol, or use illegal drugs. Some items may interact with your medicine. What should I watch for while using this medicine? Check with your doctor or health care professional right away if you have any sign of an allergic reaction. What side effects may I notice from receiving this medicine? Side effects that you should report to your doctor or health care professional as soon as possible: -allergic reactions like skin rash, itching or hives, swelling of the face, lips or tongue -breathing problems -confusion -dizziness -fast or irregular heartbeat -feeling faint or lightheaded, falls -fever and chills -loss of balance or coordination -seizures -sweating -swelling of the hands or feet -tightness in the chest -tremors -unusually weak or tired Side effects that usually do not require medical attention (report to your doctor or health care professional if they continue or are  bothersome): -constipation or diarrhea -headache This list may not describe all possible side effects. Call your doctor for medical advice about side effects. You may report side effects to FDA at 1-800-FDA-1088. Where should I keep my medicine? Keep out of the reach of children. Store between 2 and 30 degrees C (36 and 86 degrees F). Throw away any unused medicine after the expiration date. NOTE: This sheet is a summary. It may not cover all possible information. If you have questions about this medicine, talk to your doctor, pharmacist, or health care provider.  2018 Elsevier/Gold Standard (2012-12-22 16:27:45)   Topiramate extended-release capsules What is this medicine? TOPIRAMATE (toe PYRE a mate) is used to treat seizures in adults or children with epilepsy. It is also used for the prevention of migraine headaches. This medicine may be used for other purposes; ask your health care provider or pharmacist if you have questions. COMMON BRAND NAME(S): Trokendi XR What should I tell my health care provider before I take this medicine? They need to know if you have any of these conditions: -cirrhosis of the liver or liver disease -diarrhea -glaucoma -kidney stones or kidney disease -lung disease like asthma, obstructive pulmonary disease, emphysema -metabolic acidosis -on a ketogenic diet -scheduled for surgery or a procedure -suicidal thoughts, plans, or attempt; a previous suicide attempt by you or a family member -an unusual or allergic reaction  to topiramate, other medicines, foods, dyes, or preservatives -pregnant or trying to get pregnant -breast-feeding How should I use this medicine? Take this medicine by mouth with a glass of water. Follow the directions on the prescription label. Trokendi XR capsules must be swallowed whole. Do not sprinkle on food, break, crush, dissolve, or chew. Qudexy XR capsules may be swallowed whole or opened and sprinkled on a small amount of soft  food. This mixture must be swallowed immediately. Do not chew or store mixture for later use. You may take this medicine with meals. Take your medicine at regular intervals. Do not take it more often than directed. Talk to your pediatrician regarding the use of this medicine in children. Special care may be needed. While Trokendi XR may be prescribed for children as young as 6 years and Qudexy XR may be prescribed for children as young as 2 years for selected conditions, precautions do apply. Overdosage: If you think you have taken too much of this medicine contact a poison control center or emergency room at once. NOTE: This medicine is only for you. Do not share this medicine with others. What if I miss a dose? If you miss a dose, take it as soon as you can. If it is almost time for your next dose, take only that dose. Do not take double or extra doses. What may interact with this medicine? Do not take this medicine with any of the following medications: -probenecid This medicine may also interact with the following medications: -acetazolamide -alcohol -amitriptyline -birth control pills -digoxin -hydrochlorothiazide -lithium -medicines for pain, sleep, or muscle relaxation -metformin -methazolamide -other seizure or epilepsy medicines -pioglitazone -risperidone This list may not describe all possible interactions. Give your health care provider a list of all the medicines, herbs, non-prescription drugs, or dietary supplements you use. Also tell them if you smoke, drink alcohol, or use illegal drugs. Some items may interact with your medicine. What should I watch for while using this medicine? Visit your doctor or health care professional for regular checks on your progress. Do not stop taking this medicine suddenly. This increases the risk of seizures if you are using this medicine to control epilepsy. Wear a medical identification bracelet or chain to say you have epilepsy or seizures,  and carry a card that lists all your medicines. This medicine can decrease sweating and increase your body temperature. Watch for signs of deceased sweating or fever, especially in children. Avoid extreme heat, hot baths, and saunas. Be careful about exercising, especially in hot weather. Contact your health care provider right away if you notice a fever or decrease in sweating. You should drink plenty of fluids while taking this medicine. If you have had kidney stones in the past, this will help to reduce your chances of forming kidney stones. If you have stomach pain, with nausea or vomiting and yellowing of your eyes or skin, call your doctor immediately. You may get drowsy, dizzy, or have blurred vision. Do not drive, use machinery, or do anything that needs mental alertness until you know how this medicine affects you. To reduce dizziness, do not sit or stand up quickly, especially if you are an older patient. Alcohol can increase drowsiness and dizziness. Avoid alcoholic drinks. Do not drink alcohol for 6 hours before or 6 hours after taking Trokendi XR. If you notice blurred vision, eye pain, or other eye problems, seek medical attention at once for an eye exam. The use of this medicine may increase the chance  of suicidal thoughts or actions. Pay special attention to how you are responding while on this medicine. Any worsening of mood, or thoughts of suicide or dying should be reported to your health care professional right away. This medicine may increase the chance of developing metabolic acidosis. If left untreated, this can cause kidney stones, bone disease, or slowed growth in children. Symptoms include breathing fast, fatigue, loss of appetite, irregular heartbeat, or loss of consciousness. Call your doctor immediately if you experience any of these side effects. Also, tell your doctor about any surgery you plan on having while taking this medicine since this may increase your risk for metabolic  acidosis. Birth control pills may not work properly while you are taking this medicine. Talk to your doctor about using an extra method of birth control. Women who become pregnant while using this medicine may enroll in the [place] [employer] Antiepileptic Drug Pregnancy Registry by calling [phone]. This registry collects information about the safety of antiepileptic drug use during pregnancy. What side effects may I notice from receiving this medicine? Side effects that you should report to your doctor or health care professional as soon as possible: -allergic reactions like skin rash, itching or hives, swelling of the face, lips, or tongue -decreased sweating and/or rise in body temperature -depression -difficulty breathing, fast or irregular breathing patterns -difficulty speaking -difficulty walking or controlling muscle movements -hearing impairment -redness, blistering, peeling or loosening of the skin, including inside the mouth -tingling, pain or numbness in the hands or feet -unusually weak or tired -worsening of mood, thoughts or actions of suicide or dying Side effects that usually do not require medical attention (report to your doctor or health care professional if they continue or are bothersome): -altered taste -back pain, joint or muscle aches and pains -diarrhea, or constipation -headache -loss of appetite -nausea -stomach upset, indigestion -tremors This list may not describe all possible side effects. Call your doctor for medical advice about side effects. You may report side effects to FDA at 1-800-FDA-1088. Where should I keep my medicine? Keep out of the reach of children. Store at room temperature between 15 and 30 degrees C (59 and 86 degrees F) in a tightly closed container. Protect from moisture. Throw away any unused medicine after the expiration date. NOTE: This sheet is a summary. It may not cover all possible information. If you have questions about this  medicine, talk to your doctor, pharmacist, or health care provider.  2018 Elsevier/Gold Standard (2015-07-06 12:33:11)

## 2017-03-06 NOTE — Progress Notes (Signed)
[facility]    Provider:  Dr Lucia GaskinsAhern Referring Provider: Clide DalesWright, Morgan Dionne, PA Primary Care Physician:  Clide DalesWright, Morgan Dionne, PA  CC:  Migraines  HPI:  Angelena FormJasmine Benson is a 26 y.o. female here as a referral from Dr. Rehabilitation for Migraines. PMHx HTN.  She wakes every morning with headache. Daily excedrin, dialy tylenol, motrin, alleve. They started in middle school. She started a medication a few years ago and made her feel sick. The migraines are in the front of the head, pounding and throbbing, her eyes feel painful, nausea, light and sound sensitivity, a cold rag helps, she vomit. Movement makes it worse. Over the last 9 months, he has daily headaches mostly in the morning. At least 10 days ar migrainous with 3 severe migraine days a month, she has been to the ED and missed work. They can last over 4 hours. She take something daily. She snores, she is morbidly obese, she wakes with dry mouth, She does not have significant vision changes except with the migraines she gets blurry vision otherwise no vision issues, No hearing changes, no pulsating in ear, ears don't feel full, No aura, no weakness, numbness or tingling.   Reviewed notes, labs and imaging from outside physicians, which showed:  Patient has a chronic history of headaches.  She has at least 3 severe headaches a month.  Symptoms started in middle school, diagnosed with migraines, no formal evaluation recently.  She also has a history of hypertension.  Compliant with medications.  She presented to the emergency room February 11, 2017 with head pain behind both eyes associated with nausea and vomiting.  Associated light sensitivity and sound sensitivity.  Pain was 6 out of 10.  She had been having headaches persistently for several days.    Review of Systems: Patient complains of symptoms per HPI as well as the following symptoms: headache, eye pain, snoring. Pertinent negatives and positives per HPI. All  others negative.   Social History   Socioeconomic History  . Marital status: Single    Spouse name: Not on file  . Number of children: Not on file  . Years of education: Not on file  . Highest education level: Not on file  Social Needs  . Financial resource strain: Not on file  . Food insecurity - worry: Not on file  . Food insecurity - inability: Not on file  . Transportation needs - medical: Not on file  . Transportation needs - non-medical: Not on file  Occupational History  . Not on file  Tobacco Use  . Smoking status: Never Smoker  . Smokeless tobacco: Never Used  Substance and Sexual Activity  . Alcohol use: Yes    Alcohol/week: 0.6 oz    Types: 1 Glasses of wine per week    Comment: ocassionally  . Drug use: No  . Sexual activity: Yes    Birth control/protection: Implant  Other Topics Concern  . Not on file  Social History Narrative  . Not on file    Family History  Problem Relation Age of Onset  . Hypertension Mother   . Cancer Maternal Grandfather        BLADDER  . Cancer Paternal Grandfather        PROSTATE  . Migraines Neg Hx     Past Medical History:  Diagnosis Date  . Cervical dysplasia   . Chlamydia 2016  . Gonorrhea 08/2016  . 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 Hypertension   . Migraines   .  Normal spontaneous vaginal delivery   . Trichimoniasis     Past Surgical History:  Procedure Laterality Date  . TONSILLECTOMY AND ADENOIDECTOMY  AGE 51 (2010)    Current Outpatient Medications  Medication Sig Dispense Refill  . acetaminophen (TYLENOL) 325 MG tablet Take 650 mg by mouth every 6 (six) hours as needed.    Marland Kitchen aspirin-acetaminophen-caffeine (EXCEDRIN MIGRAINE) 250-250-65 MG per tablet Take by mouth every 6 (six) hours as needed for headache.    . fluticasone (FLONASE) 50 MCG/ACT nasal spray PLACE 2 SPRAYS INTO EACH NOSTRIL DAILY    . hydrochlorothiazide (HYDRODIURIL) 25 MG tablet Take 25 mg by mouth daily.    . Vitamin D,  Ergocalciferol, (DRISDOL) 50000 units CAPS capsule Take by mouth.    . ondansetron (ZOFRAN) 4 MG tablet Take 1 tablet (4 mg total) by mouth every 8 (eight) hours as needed for nausea or vomiting. Or headache/migraine. May take with Rizatriptan. 20 tablet 11  . rizatriptan (MAXALT) 10 MG tablet Take 1 tablet (10 mg total) by mouth as needed for migraine. May repeat in 2 hours if needed. Max 2 in one day 10 tablet 11  . Topiramate ER (TROKENDI XR) 100 MG CP24 Take 100 mg by mouth at bedtime. 30 capsule 11   Current Facility-Administered Medications  Medication Dose Route Frequency Provider Last Rate Last Dose  . cefTRIAXone (ROCEPHIN) injection 250 mg  250 mg Intramuscular Once Ok Edwards, MD        Allergies as of 03/06/2017  . (No Known Allergies)    Vitals: BP (!) 168/107 (BP Location: Right Arm, Patient Position: Sitting, Cuff Size: Normal)   Pulse 67   Ht 5\' 6"  (1.676 m)   Wt 239 lb 6.4 oz (108.6 kg)   BMI 38.64 kg/m  Last Weight:  Wt Readings from Last 1 Encounters:  03/06/17 239 lb 6.4 oz (108.6 kg)   Last Height:   Ht Readings from Last 1 Encounters:  03/06/17 5\' 6"  (1.676 m)   Physical exam: Exam: Gen: NAD, conversant, well nourised, obese, well groomed                     CV: RRR, no MRG. No Carotid Bruits. No peripheral edema, warm, nontender Eyes: Conjunctivae clear without exudates or hemorrhage  Neuro: Detailed Neurologic Exam  Speech:    Speech is normal; fluent and spontaneous with normal comprehension.  Cognition:    The patient is oriented to person, place, and time;     recent and remote memory intact;     language fluent;     normal attention, concentration,     fund of knowledge Cranial Nerves:    The pupils are equal, round, and reactive to light. The fundi are normal and spontaneous venous pulsations are present. Visual fields are full to finger confrontation. Extraocular movements are intact. Trigeminal sensation is intact and the muscles of  mastication are normal. The face is symmetric. The palate elevates in the midline. Hearing intact. Voice is normal. Shoulder shrug is normal. The tongue has normal motion without fasciculations.   Coordination:    Normal finger to nose and heel to shin. Normal rapid alternating movements.   Gait:    Heel-toe and tandem gait are normal.   Motor Observation:    No asymmetry, no atrophy, and no involuntary movements noted. Tone:    Normal muscle tone.    Posture:    Posture is normal. normal erect    Strength:  Strength is V/V in the upper and lower limbs.      Sensation: intact to LT     Reflex Exam:  DTR's:    Deep tendon reflexes in the upper and lower extremities are symmetrical bilaterally.   Toes:    The toes are downgoing bilaterally.   Clonus:    Clonus is absent.       Assessment/Plan:  Chronic daily mornig headaches, migraines and medication overuse(rebound headache)  I had a long discussion with patient that her daily use of ibuprofen or Tylenol can cause medication overuse/rebound headache which is likely contributing her chronic daily headaches.   Orders Placed This Encounter  Procedures  . MR BRAIN W WO CONTRAST  . MR ORBITS W WO CONTRAST  . Comprehensive metabolic panel  . CBC  . Ambulatory referral to Sleep Studies     Migraines: Discussed migraine management including acute management and preventative management. We'll start patient on both. Discussed Trokendi side effects especially teratogenicity do not get pregnant and use birth control.  Remember to drink plenty of fluid, eat healthy meals and do not skip any meals. Try to eat protein with a every meal and eat a healthy snack such as fruit or nuts in between meals. Try to keep a regular sleep-wake schedule and try to exercise daily, particularly in the form of walking, 20-30 minutes a day, if you can.   As far as your medications are concerned, I would like to suggest: - Stop daily over the  counter med use (Tylenol, excedrin, ibuprofen, alleve) Do not tak emore than 2-3x in one week -At onset of Migraine Rizatriptan and zofran. Please take one tablet at the onset of your headache. If it does not improve the symptoms please take one additional tablet. Do not take more then 2 tablets in 24hrs. Do not take use more then 2 to 3 days in a week. - Trokendi week one 25mg , week two 50mg  week three 100mg . Takes 4-6 weeks for medications to work.  As far as diagnostic testing: need MRI brain and orbits due to concerning symptoms of positional headache, morning and nocturnal headache, orbital pain, vision changes to evaluate for lesions, masses, tumors or idiopathic intracranial hypertension.  Sleep evaluation: morbid obesity, daily morning headaches, snoring, ESS 10  - RTC 8 weeks, keep migraine diary, spent time educating patient on migraines and the differential, OSA.  Discussed; To prevent or relieve headaches, try the following:  Cool Compress. Lie down and place a cool compress on your head.   Avoid headache triggers. If certain foods or odors seem to have triggered your migraines in the past, avoid them. A headache diary might help you identify triggers.   Include physical activity in your daily routine. Try a daily walk or other moderate aerobic exercise.   Manage stress. Find healthy ways to cope with the stressors, such as delegating tasks on your to-do list.   Practice relaxation techniques. Try deep breathing, yoga, massage and visualization.   Eat regularly. Eating regularly scheduled meals and maintaining a healthy diet might help prevent headaches. Also, drink plenty of fluids.   Follow a regular sleep schedule. Sleep deprivation might contribute to headaches  Consider biofeedback. With this mind-body technique, you learn to control certain bodily functions - such as muscle tension, heart rate and blood pressure - to prevent headaches or reduce headache pain.     Proceed to emergency room if you experience new or worsening symptoms or symptoms do not resolve, if you  have new neurologic symptoms or if headache is severe, or for any concerning symptom.   Cc: Clide DalesWright, Morgan Dionne, [place]   Naomie DeanAntonia Ahern, MD  [facility] [address] [place], [place] [id number]  Phone [phone] Fax [phone]

## 2017-03-21 ENCOUNTER — Inpatient Hospital Stay: Admission: RE | Admit: 2017-03-21 | Payer: BLUE CROSS/BLUE SHIELD | Source: Ambulatory Visit

## 2017-03-21 ENCOUNTER — Other Ambulatory Visit: Payer: BLUE CROSS/BLUE SHIELD

## 2017-04-09 ENCOUNTER — Encounter: Payer: BLUE CROSS/BLUE SHIELD | Admitting: Obstetrics & Gynecology

## 2017-04-15 ENCOUNTER — Telehealth: Payer: Self-pay

## 2017-04-15 ENCOUNTER — Institutional Professional Consult (permissible substitution): Payer: BLUE CROSS/BLUE SHIELD | Admitting: Neurology

## 2017-04-15 NOTE — Telephone Encounter (Signed)
Pt did not show for their appt with Dr. Athar today.  

## 2017-04-21 ENCOUNTER — Encounter: Payer: Self-pay | Admitting: Neurology

## 2017-05-04 ENCOUNTER — Telehealth: Payer: Self-pay | Admitting: *Deleted

## 2017-05-04 NOTE — Telephone Encounter (Signed)
Called patient and LVM informing patient that appt scheduled for tomorrow Tuesday 2/5 @ 1:00 will need to be r/s d/t Dr. Lucia GaskinsAhern unexpectedly being out of office. Left office number for patient to call back and r/s appt.

## 2017-05-05 ENCOUNTER — Ambulatory Visit: Payer: BLUE CROSS/BLUE SHIELD | Admitting: Neurology

## 2017-05-05 NOTE — Telephone Encounter (Signed)
Called patient once more to r/s appt. LVM with office number asking for call back.

## 2017-05-20 ENCOUNTER — Encounter: Payer: BLUE CROSS/BLUE SHIELD | Admitting: Women's Health

## 2017-05-20 DIAGNOSIS — Z0289 Encounter for other administrative examinations: Secondary | ICD-10-CM

## 2018-02-08 ENCOUNTER — Ambulatory Visit: Payer: BLUE CROSS/BLUE SHIELD | Admitting: Neurology

## 2018-02-09 ENCOUNTER — Ambulatory Visit: Payer: BLUE CROSS/BLUE SHIELD | Admitting: Neurology

## 2018-02-09 ENCOUNTER — Encounter: Payer: Self-pay | Admitting: Neurology

## 2018-02-09 VITALS — BP 167/96 | HR 74 | Ht 66.0 in | Wt 231.0 lb

## 2018-02-09 DIAGNOSIS — G43709 Chronic migraine without aura, not intractable, without status migrainosus: Secondary | ICD-10-CM | POA: Diagnosis not present

## 2018-02-09 DIAGNOSIS — J320 Chronic maxillary sinusitis: Secondary | ICD-10-CM | POA: Diagnosis not present

## 2018-02-09 DIAGNOSIS — G4440 Drug-induced headache, not elsewhere classified, not intractable: Secondary | ICD-10-CM

## 2018-02-09 DIAGNOSIS — IMO0002 Reserved for concepts with insufficient information to code with codable children: Secondary | ICD-10-CM

## 2018-02-09 MED ORDER — ERENUMAB-AOOE 140 MG/ML ~~LOC~~ SOAJ: 140.0000 mg | SUBCUTANEOUS | 11 refills | Status: DC

## 2018-02-09 MED ORDER — ONDANSETRON HCL 4 MG PO TABS: 4.0000 mg | ORAL_TABLET | Freq: Three times a day (TID) | ORAL | 11 refills | Status: DC | PRN

## 2018-02-09 MED ORDER — RIZATRIPTAN BENZOATE 10 MG PO TABS: 10.0000 mg | ORAL_TABLET | ORAL | 11 refills | Status: DC | PRN

## 2018-02-09 NOTE — Patient Instructions (Addendum)
Analgesic Rebound Headache An analgesic rebound headache, sometimes called a medication overuse headache, is a headache that comes after pain medicine (analgesic) taken to treat the original (primary) headache has worn off. Any type of primary headache can return as a rebound headache if a person regularly takes analgesics more than three times a week to treat it. The types of primary headaches that are commonly associated with rebound headaches include:  Migraines.  Headaches that arise from tense muscles in the head and neck area (tension headaches).  Headaches that develop and happen again (recur) on one side of the head and around the eye (cluster headaches).  If rebound headaches continue, they become chronic daily headaches. What are the causes? This condition may be caused by frequent use of:  Over-the-counter medicines such as aspirin, ibuprofen, and acetaminophen.  Sinus relief medicines and other medicines that contain caffeine.  Narcotic pain medicines such as codeine and oxycodone.  What are the signs or symptoms? The symptoms of a rebound headache are the same as the symptoms of the original headache. Some of the symptoms of specific types of headaches include: Migraine headache  Pulsing or throbbing pain on one or both sides of the head.  Severe pain that interferes with daily activities.  Pain that is worsened by physical activity.  Nausea, vomiting, or both.  Pain with exposure to bright light, loud noises, or strong smells.  General sensitivity to bright light, loud noises, or strong smells.  Visual changes.  Numbness of one or both arms. Tension headache  Pressure around the head.  Dull, aching head pain.  Pain felt over the front and sides of the head.  Tenderness in the muscles of the head, neck, and shoulders. Cluster headache  Severe pain that begins in or around one eye or temple.  Redness and tearing in the eye on the same side as the  pain.  Droopy or swollen eyelid.  One-sided head pain.  Nausea.  Runny nose.  Sweaty, pale facial skin.  Restlessness. How is this diagnosed? This condition is diagnosed by:  Reviewing your medical history. This includes the nature of your primary headaches.  Reviewing the types of pain medicines that you have been using to treat your headaches and how often you take them.  How is this treated? This condition may be treated or managed by:  Discontinuing frequent use of the analgesic medicine. Doing this may worsen your headaches at first, but the pain should eventually become more manageable, less frequent, and less severe.  Seeing a headache specialist. He or she may be able to help you manage your headaches and help make sure there is not another cause of the headaches.  Using methods of stress relief, such as acupuncture, counseling, biofeedback, and massage. Talk with your health care provider about which methods might be good for you.  Follow these instructions at home:  Take over-the-counter and prescription medicines only as told by your health care provider.  Stop the repeated use of pain medicine as told by your health care provider. Stopping can be difficult. Carefully follow instructions from your health care provider.  Avoid triggers that are known to cause your primary headaches.  Keep all follow-up visits as told by your health care provider. This is important. Contact a health care provider if:  You continue to experience headaches after following treatments that your health care provider recommended. Get help right away if:  You develop new headache pain.  You develop headache pain that is different   than what you have experienced in the past.  You develop numbness or tingling in your arms or legs.  You develop changes in your speech or vision. This information is not intended to replace advice given to you by your health care provider. Make sure you  discuss any questions you have with your health care provider. Document Released: 06/07/2003 Document Revised: 10/05/2015 Document Reviewed: 08/20/2015 Elsevier Interactive Patient Education  2018 [employer]  [employer]renumab: Patient drug information [employer] here. Copyright [id number] [employer]. All rights reserved. (For additional information see "Erenumab: Drug information") Brand Names: [place]  Aimovig;  Aimovig (140 MG Dose) [DSC]  Brand Names: [place]  Aimovig  What is this drug used for?   It is used to prevent migraine headaches.  What do I need to tell my doctor BEFORE I take this drug?   If you have an allergy to this drug or any part of this drug.   If you are allergic to any drugs like this one, any other drugs, foods, or other substances. Tell your doctor about the allergy and what signs you had, like rash; hives; itching; shortness of breath; wheezing; cough; swelling of face, lips, tongue, or throat; or any other signs.   This drug may interact with other drugs or health problems.   Tell your doctor and pharmacist about all of your drugs (prescription or OTC, natural products, vitamins) and health problems. You must check to make sure that it is safe for you to take this drug with all of your drugs and health problems. Do not start, stop, or change the dose of any drug without checking with your doctor.  What are some things I need to know or do while I take this drug?   Tell all of your health care providers that you take this drug. This includes your doctors, nurses, pharmacists, and dentists.   If you have a latex allergy, talk with your doctor.   Tell your doctor if you are pregnant, plan on getting pregnant, or are breast-feeding. You will need to talk about the benefits and risks to you and the baby.  What are some side effects that I need to call my doctor about right away?   WARNING/CAUTION: Even though it may be rare, some people may have very bad and  sometimes deadly side effects when taking a drug. Tell your doctor or get medical help right away if you have any of the following signs or symptoms that may be related to a very bad side effect:   Signs of an allergic reaction, like rash; hives; itching; red, swollen, blistered, or peeling skin with or without fever; wheezing; tightness in the chest or throat; trouble breathing, swallowing, or talking; unusual hoarseness; or swelling of the mouth, face, lips, tongue, or throat.  What are some other side effects of this drug?   All drugs may cause side effects. However, many people have no side effects or only have minor side effects. Call your doctor or get medical help if any of these side effects or any other side effects bother you or do not go away:   Pain, redness, or swelling where the injection was given.   Constipation is common with this drug. In some people, severe constipation led to treatment in a hospital or surgery. Call your doctor right away if you have constipation that is severe or does not go away.   These are not all of the side effects that may occur. If you  have questions about side effects, call your doctor. Call your doctor for medical advice about side effects.   You may report side effects to your national health agency.  How is this drug best taken?   Use this drug as ordered by your doctor. Read all information given to you. Follow all instructions closely.   It is given as a shot into the fatty part of the skin on the top of the thigh, belly area, or upper arm.   If you will be giving yourself the shot, your doctor or nurse will teach you how to give the shot.   If stored in a refrigerator, let this drug come to room temperature before using it. Leave it at room temperature for at least 30 minutes. Do not heat this drug.   Protect from heat and sunlight.   Do not shake.   Do not give into skin within 2 inches of the belly button.   Do not give into skin that is  irritated, tender, bruised, red, scaly, hard, scarred, or has stretch marks.   Do not use if the solution is cloudy, leaking, or has particles.   This drug is colorless to a faint yellow. Do not use if the solution changes color.   Throw away after using. Do not use the device more than 1 time.   Throw away needles in a needle/sharp disposal box. Do not reuse needles or other items. When the box is full, follow all local rules for getting rid of it. Talk with a doctor or pharmacist if you have any questions.  What do I do if I miss a dose?   Take a missed dose as soon as you think about it.   After taking a missed dose, start a new schedule based on when the dose is taken.  How do I store and/or throw out this drug?   Store in a refrigerator. Do not freeze.   Store in the original container to protect from light.   Do not use if it has been frozen.   If you drop this drug on a hard surface, do not use it.   If needed, you may store at room temperature for up to 7 days. Write down the date you take this drug out of the refrigerator. If stored at room temperature and not used within 7 days, throw this drug away.   Do not put this drug back in the refrigerator after it has been stored at room temperature.   Keep all drugs in a safe place. Keep all drugs out of the reach of children and pets.   Throw away unused or expired drugs. Do not flush down a toilet or pour down a drain unless you are told to do so. Check with your pharmacist if you have questions about the best way to throw out drugs. There may be drug take-back programs in your area.  General drug facts   If your symptoms or health problems do not get better or if they become worse, call your doctor.   Do not share your drugs with others and do not take anyone else's drugs.   Keep a list of all your drugs (prescription, natural products, vitamins, OTC) with you. Give this list to your doctor.   Talk with the doctor before starting any  new drug, including prescription or OTC, natural products, or vitamins.   Some drugs may have another patient information leaflet. If you have any questions about this drug,  please talk with your doctor, nurse, pharmacist, or other health care provider.   If you think there has been an overdose, call your poison control center or get medical care right away. Be ready to tell or show what was taken, how much, and when it happened.  Erenumab: Patient drug information [employer] here. Copyright [phone] [employer]. All rights reserved. (For additional information see "Erenumab: Drug information") Brand Names: [place]  Aimovig;  Aimovig (140 MG Dose) [DSC]  Brand Names: [place]  Aimovig  What is this drug used for?   It is used to prevent migraine headaches.  What do I need to tell my doctor BEFORE I take this drug?   If you have an allergy to this drug or any part of this drug.   If you are allergic to any drugs like this one, any other drugs, foods, or other substances. Tell your doctor about the allergy and what signs you had, like rash; hives; itching; shortness of breath; wheezing; cough; swelling of face, lips, tongue, or throat; or any other signs.   This drug may interact with other drugs or health problems.   Tell your doctor and pharmacist about all of your drugs (prescription or OTC, natural products, vitamins) and health problems. You must check to make sure that it is safe for you to take this drug with all of your drugs and health problems. Do not start, stop, or change the dose of any drug without checking with your doctor.  What are some things I need to know or do while I take this drug?   Tell all of your health care providers that you take this drug. This includes your doctors, nurses, pharmacists, and dentists.   If you have a latex allergy, talk with your doctor.   Tell your doctor if you are pregnant, plan on getting pregnant, or are breast-feeding. You will  need to talk about the benefits and risks to you and the baby.  What are some side effects that I need to call my doctor about right away?   WARNING/CAUTION: Even though it may be rare, some people may have very bad and sometimes deadly side effects when taking a drug. Tell your doctor or get medical help right away if you have any of the following signs or symptoms that may be related to a very bad side effect:   Signs of an allergic reaction, like rash; hives; itching; red, swollen, blistered, or peeling skin with or without fever; wheezing; tightness in the chest or throat; trouble breathing, swallowing, or talking; unusual hoarseness; or swelling of the mouth, face, lips, tongue, or throat.  What are some other side effects of this drug?   All drugs may cause side effects. However, many people have no side effects or only have minor side effects. Call your doctor or get medical help if any of these side effects or any other side effects bother you or do not go away:   Pain, redness, or swelling where the injection was given.   Constipation is common with this drug. In some people, severe constipation led to treatment in a hospital or surgery. Call your doctor right away if you have constipation that is severe or does not go away.   These are not all of the side effects that may occur. If you have questions about side effects, call your doctor. Call your doctor for medical advice about side effects.   You may report side effects to your national  health agency.  How is this drug best taken?   Use this drug as ordered by your doctor. Read all information given to you. Follow all instructions closely.   It is given as a shot into the fatty part of the skin on the top of the thigh, belly area, or upper arm.   If you will be giving yourself the shot, your doctor or nurse will teach you how to give the shot.   If stored in a refrigerator, let this drug come to room temperature before using it. Leave it  at room temperature for at least 30 minutes. Do not heat this drug.   Protect from heat and sunlight.   Do not shake.   Do not give into skin within 2 inches of the belly button.   Do not give into skin that is irritated, tender, bruised, red, scaly, hard, scarred, or has stretch marks.   Do not use if the solution is cloudy, leaking, or has particles.   This drug is colorless to a faint yellow. Do not use if the solution changes color.   Throw away after using. Do not use the device more than 1 time.   Throw away needles in a needle/sharp disposal box. Do not reuse needles or other items. When the box is full, follow all local rules for getting rid of it. Talk with a doctor or pharmacist if you have any questions.  What do I do if I miss a dose?   Take a missed dose as soon as you think about it.   After taking a missed dose, start a new schedule based on when the dose is taken.  How do I store and/or throw out this drug?   Store in a refrigerator. Do not freeze.   Store in the original container to protect from light.   Do not use if it has been frozen.   If you drop this drug on a hard surface, do not use it.   If needed, you may store at room temperature for up to 7 days. Write down the date you take this drug out of the refrigerator. If stored at room temperature and not used within 7 days, throw this drug away.   Do not put this drug back in the refrigerator after it has been stored at room temperature.   Keep all drugs in a safe place. Keep all drugs out of the reach of children and pets.   Throw away unused or expired drugs. Do not flush down a toilet or pour down a drain unless you are told to do so. Check with your pharmacist if you have questions about the best way to throw out drugs. There may be drug take-back programs in your area.  General drug facts   If your symptoms or health problems do not get better or if they become worse, call your doctor.   Do not share your drugs  with others and do not take anyone else's drugs.   Keep a list of all your drugs (prescription, natural products, vitamins, OTC) with you. Give this list to your doctor.   Talk with the doctor before starting any new drug, including prescription or OTC, natural products, or vitamins.   Some drugs may have another patient information leaflet. If you have any questions about this drug, please talk with your doctor, nurse, pharmacist, or other health care provider.   If you think there has been an overdose, call your poison control center  or get medical care right away. Be ready to tell or show what was taken, how much, and when it happened.

## 2018-02-09 NOTE — Progress Notes (Addendum)
[facility]    Provider:  Dr Lucia Gaskins Referring Provider: Clide Dales, PA Primary Care Physician:  Clide Dales, PA  CC:  Migraines  Interval history: She is still overusing medications. Discussed medication overuse.  She tried the Trokendi, had side effects.  She never had the MRI or the sleep tests completed that ordered.  The migraines are in the front of the head, pounding and throbbing, her eyes feel painful, nausea, light and sound sensitivity, a cold rag helps, she vomit. Movement makes it worse. Over the last 9 months, he has daily headaches mostly in the morning. At least 10 days ar migrainous with 3 severe migraine days a month,She also chronic sinus issues. Still waking up with headaches, positional headaches and exertion. No vision changes.  Prventative: Trokendi, maxalt, zofran, atenolol, reglan, zofran,   HPI:  Sara Benson is a 27 y.o. female here as a referral from Dr. Delford Field for Migraines. PMHx HTN.  She wakes every morning with headache. Daily excedrin, dialy tylenol, motrin, alleve. They started in middle school. She started a medication a few years ago and made her feel sick. The migraines are in the front of the head, pounding and throbbing, her eyes feel painful, nausea, light and sound sensitivity, a cold rag helps, she vomit. Movement makes it worse. Over the last 9 months, he has daily headaches mostly in the morning. At least 10 days ar migrainous with 3 severe migraine days a month, she has been to the ED and missed work. They can last over 4 hours. She take something daily. She snores, she is morbidly obese, she wakes with dry mouth, She does not have significant vision changes except with the migraines she gets blurry vision otherwise no vision issues, No hearing changes, no pulsating in ear, ears don't feel full, No aura, no weakness, numbness or tingling.   Reviewed notes, labs and imaging from outside physicians, which  showed:  Patient has a chronic history of headaches.  She has at least 3 severe headaches a month.  Symptoms started in middle school, diagnosed with migraines, no formal evaluation recently.  She also has a history of hypertension.  Compliant with medications.  She presented to the emergency room February 11, 2017 with head pain behind both eyes associated with nausea and vomiting.  Associated light sensitivity and sound sensitivity.  Pain was 6 out of 10.  She had been having headaches persistently for several days.    Review of Systems: Patient complains of symptoms per HPI as well as the following symptoms: headache, eye pain, nausea, dizziness. Pertinent negatives and positives per HPI. All others negative.   Social History   Socioeconomic History  . Marital status: Single    Spouse name: Not on file  . Number of children: Not on file  . Years of education: Not on file  . Highest education level: Not on file  Occupational History  . Not on file  Social Needs  . Financial resource strain: Not on file  . Food insecurity:    Worry: Not on file    Inability: Not on file  . Transportation needs:    Medical: Not on file    Non-medical: Not on file  Tobacco Use  . Smoking status: Never Smoker  . Smokeless tobacco: Never Used  Substance and Sexual Activity  . Alcohol use: Yes    Alcohol/week: 1.0 standard drinks    Types: 1 Glasses of wine per week    Comment: ocassionally  .  Drug use: No  . Sexual activity: Yes    Birth control/protection: Implant  Lifestyle  . Physical activity:    Days per week: Not on file    Minutes per session: Not on file  . Stress: Not on file  Relationships  . Social connections:    Talks on phone: Not on file    Gets together: Not on file    Attends religious service: Not on file    Active member of club or organization: Not on file    Attends meetings of clubs or organizations: Not on file    Relationship status: Not on file  . Intimate  partner violence:    Fear of current or ex partner: Not on file    Emotionally abused: Not on file    Physically abused: Not on file    Forced sexual activity: Not on file  Other Topics Concern  . Not on file  Social History Narrative  . Not on file    Family History  Problem Relation Age of Onset  . Hypertension Mother   . Cancer Maternal Grandfather        BLADDER  . Cancer Paternal Grandfather        PROSTATE  . Migraines Neg Hx     Past Medical History:  Diagnosis Date  . Cervical dysplasia   . Chlamydia 2016  . Gonorrhea 08/2016  . 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 Hypertension   . Migraines   . Normal spontaneous vaginal delivery   . Trichimoniasis     Past Surgical History:  Procedure Laterality Date  . TONSILLECTOMY AND ADENOIDECTOMY  AGE 54 (2010)    Current Outpatient Medications  Medication Sig Dispense Refill  . acetaminophen (TYLENOL) 325 MG tablet Take 650 mg by mouth every 6 (six) hours as needed.    Marland Kitchen aspirin-acetaminophen-caffeine (EXCEDRIN MIGRAINE) 250-250-65 MG per tablet Take by mouth every 6 (six) hours as needed for headache.    . fluticasone (FLONASE) 50 MCG/ACT nasal spray PLACE 2 SPRAYS INTO EACH NOSTRIL DAILY    . hydrochlorothiazide (HYDRODIURIL) 25 MG tablet Take 25 mg by mouth daily.    . ondansetron (ZOFRAN) 4 MG tablet Take 1 tablet (4 mg total) by mouth every 8 (eight) hours as needed for nausea or vomiting. Or headache/migraine. May take with Rizatriptan. 20 tablet 11  . rizatriptan (MAXALT) 10 MG tablet Take 1 tablet (10 mg total) by mouth as needed for migraine. May repeat in 2 hours if needed. Max 2 in one day 10 tablet 11  . Topiramate ER (TROKENDI XR) 100 MG CP24 Take 100 mg by mouth at bedtime. 30 capsule 11  . Vitamin D, Ergocalciferol, (DRISDOL) 50000 units CAPS capsule Take by mouth.    . hydrochlorothiazide (HYDRODIURIL) 25 MG tablet Take 25 mg by mouth daily.     Current Facility-Administered Medications   Medication Dose Route Frequency Provider Last Rate Last Dose  . cefTRIAXone (ROCEPHIN) injection 250 mg  250 mg Intramuscular Once Ok Edwards, MD        Allergies as of 02/09/2018  . (No Known Allergies)    Vitals: BP (!) 167/96   Pulse 74   Ht 5\' 6"  (1.676 m)   Wt 231 lb (104.8 kg)   BMI 37.28 kg/m  Last Weight:  Wt Readings from Last 1 Encounters:  02/09/18 231 lb (104.8 kg)   Last Height:   Ht Readings from Last 1 Encounters:  02/09/18 5\' 6"  (1.676 m)  Physical exam: Exam: Gen: NAD, conversant, well nourised, obese, well groomed                     CV: RRR, no MRG. No Carotid Bruits. No peripheral edema, warm, nontender Eyes: Conjunctivae clear without exudates or hemorrhage  Neuro: Detailed Neurologic Exam  Speech:    Speech is normal; fluent and spontaneous with normal comprehension.  Cognition:    The patient is oriented to person, place, and time;     recent and remote memory intact;     language fluent;     normal attention, concentration,     fund of knowledge Cranial Nerves:    The pupils are equal, round, and reactive to light. The fundi are normal and spontaneous venous pulsations are present. Visual fields are full to finger confrontation. Extraocular movements are intact. Trigeminal sensation is intact and the muscles of mastication are normal. The face is symmetric. The palate elevates in the midline. Hearing intact. Voice is normal. Shoulder shrug is normal. The tongue has normal motion without fasciculations.   Coordination:    Normal finger to nose and heel to shin. Normal rapid alternating movements.   Gait:    Heel-toe and tandem gait are normal.   Motor Observation:    No asymmetry, no atrophy, and no involuntary movements noted. Tone:    Normal muscle tone.    Posture:    Posture is normal. normal erect    Strength:    Strength is V/V in the upper and lower limbs.      Sensation: intact to LT     Reflex Exam:  DTR's:     Deep tendon reflexes in the upper and lower extremities are symmetrical bilaterally.   Toes:    The toes are downgoing bilaterally.   Clonus:    Clonus is absent.       Assessment/Plan:  Chronic daily mornig headaches, migraines and medication overuse(rebound headache)  I had a long discussion with patient that her daily use of ibuprofen or Tylenol can cause medication overuse/rebound headache which is likely contributing her chronic daily headaches. She takes Daily excedrin and others. Discussed organ effects   Prventative: Trokendi, maxalt, zofran, atenolol, reglan, zofran,   Suspect noncompliance, she has failed medications, continues daily analgesic use, she never followed back up after appointment a year ago. Try aimovig, once monthly helps with compliance and there are studies showing ot helps with medication overuse  F/u 6 months  Chronic maxillary sinusitis and maxillary pressuer: Referred to ENT Dr. Haroldine Laws  Recommended MRI, she declines, CT in 2018 was normal Recommend sleep apnea eval, she declines  Meds ordered this encounter  Medications  . Erenumab-aooe (AIMOVIG) 140 MG/ML SOAJ    Sig: Inject 140 mg into the skin every 30 (thirty) days.    Dispense:  1 pen    Refill:  11  . rizatriptan (MAXALT) 10 MG tablet    Sig: Take 1 tablet (10 mg total) by mouth as needed for migraine. May repeat in 2 hours if needed. Max 2 in one day    Dispense:  10 tablet    Refill:  11  . ondansetron (ZOFRAN) 4 MG tablet    Sig: Take 1 tablet (4 mg total) by mouth every 8 (eight) hours as needed for nausea or vomiting. Or headache/migraine. May take with Rizatriptan.    Dispense:  20 tablet    Refill:  11      As far as your medications  are concerned, I would like to suggest: - Stop daily over the counter med use (Tylenol, excedrin, ibuprofen, alleve) Do not tak emore than 2-3x in one week -At onset of Migraine Rizatriptan and zofran. Please take one tablet at the onset of your  headache. If it does not improve the symptoms please take one additional tablet. Do not take more then 2 tablets in 24hrs. Do not take use more then 2 to 3 days in a week.  Prior visit 2018:  As far as diagnostic testing: need MRI brain and orbits due to concerning symptoms of positional headache, morning and nocturnal headache, orbital pain, vision changes to evaluate for lesions, masses, tumors or idiopathic intracranial hypertension. She never had it completred.  Sleep evaluation: morbid obesity, daily morning headaches, snoring, ESS 10. She did not completes  - RTC 8 weeks, keep migraine diary, spent time educating patient on migraines and the differential, OSA. Patient never returned for follow up.   To prevent or relieve headaches, try the following: Cool Compress. Lie down and place a cool compress on your head.  Avoid headache triggers. If certain foods or odors seem to have triggered your migraines in the past, avoid them. A headache diary might help you identify triggers.  Include physical activity in your daily routine. Try a daily walk or other moderate aerobic exercise.  Manage stress. Find healthy ways to cope with the stressors, such as delegating tasks on your to-do list.  Practice relaxation techniques. Try deep breathing, yoga, massage and visualization.  Eat regularly. Eating regularly scheduled meals and maintaining a healthy diet might help prevent headaches. Also, drink plenty of fluids.  Follow a regular sleep schedule. Sleep deprivation might contribute to headaches Consider biofeedback. With this mind-body technique, you learn to control certain bodily functions - such as muscle tension, heart rate and blood pressure - to prevent headaches or reduce headache pain.    Proceed to emergency room if you experience new or worsening symptoms or symptoms do not resolve, if you have new neurologic symptoms or if headache is severe, or for any concerning symptom.   Provided  education and documentation from American headache Society toolbox including articles on: chronic migraine medication overuse headache, chronic migraines, prevention of migraines, behavioral and other nonpharmacologic treatments for headache.  Cc: Clide Dales, [place]   Naomie Dean, MD  [facility] [address] [place], [place] [address]  Phone [phone] Fax [phone]  A total of 40 minutes was spent face-to-face with this patient. Over half this time was spent on counseling patient on the  1. Chronic migraine without aura without status migrainosus, not intractable   2. Medication overuse headache    diagnosis and different diagnostic and therapeutic options, counseling and coordination of care, risks ans benefits of management, compliance, or risk factor reduction and education.

## 2018-02-09 NOTE — Addendum Note (Signed)
Addended by: Naomie DeanAHERN, Caelen Reierson B on: 02/09/2018 10:30 AM   Modules accepted: Orders

## 2018-02-10 ENCOUNTER — Telehealth: Payer: Self-pay

## 2018-02-10 NOTE — Telephone Encounter (Signed)
Aimovig PA was approved instantly Key:[id number]  Rx number: [id number] Tried medications: Maxalt, Trokendi, Zofran, Reglan and Atenolol. ICD 10: G43.709 Approved 02-10-2018- 05-10-2018. [employer]  [address] [place], [place] [address] Tel: [phone] Fax: [phone]

## 2018-03-02 ENCOUNTER — Encounter: Payer: BLUE CROSS/BLUE SHIELD | Admitting: Women's Health

## 2018-05-04 ENCOUNTER — Telehealth: Payer: Self-pay | Admitting: *Deleted

## 2018-05-04 ENCOUNTER — Other Ambulatory Visit: Payer: Self-pay | Admitting: *Deleted

## 2018-05-04 DIAGNOSIS — IMO0002 Reserved for concepts with insufficient information to code with codable children: Secondary | ICD-10-CM

## 2018-05-04 DIAGNOSIS — R519 Headache, unspecified: Secondary | ICD-10-CM

## 2018-05-04 DIAGNOSIS — G43709 Chronic migraine without aura, not intractable, without status migrainosus: Secondary | ICD-10-CM

## 2018-05-04 DIAGNOSIS — R51 Headache with orthostatic component, not elsewhere classified: Secondary | ICD-10-CM

## 2018-05-04 DIAGNOSIS — R0683 Snoring: Secondary | ICD-10-CM

## 2018-05-04 NOTE — Progress Notes (Signed)
Pt ready to proceed with sleep eval. I spoke with Dr. Lucia Gaskins and placed a new order.

## 2018-05-04 NOTE — Telephone Encounter (Addendum)
Completed Aimovig PA for continuation. I also spoke with pt on the phone to confirm that Aimovig is helping and it is. However pt is interested in doing the sleep study now because she still has headaches some in the mornings. I educated pt that the Rizatriptan and Ondansetron are only for use with a migraine and not to be used as a preventative. Pt verbalized understanding and appreciation.   Cover My Meds KEY: [id number]

## 2018-05-06 NOTE — Telephone Encounter (Signed)
Aimovig PA Approved today  Effective from 05/04/2018 through 05/05/2019.

## 2018-05-10 ENCOUNTER — Encounter: Payer: Self-pay | Admitting: Neurology

## 2018-05-10 ENCOUNTER — Ambulatory Visit: Payer: BLUE CROSS/BLUE SHIELD | Admitting: Neurology

## 2018-05-10 VITALS — Ht 67.0 in | Wt 232.0 lb

## 2018-05-10 DIAGNOSIS — G4440 Drug-induced headache, not elsewhere classified, not intractable: Secondary | ICD-10-CM | POA: Diagnosis not present

## 2018-05-10 DIAGNOSIS — R0683 Snoring: Secondary | ICD-10-CM

## 2018-05-10 DIAGNOSIS — G43709 Chronic migraine without aura, not intractable, without status migrainosus: Secondary | ICD-10-CM | POA: Diagnosis not present

## 2018-05-10 DIAGNOSIS — IMO0002 Reserved for concepts with insufficient information to code with codable children: Secondary | ICD-10-CM

## 2018-05-10 DIAGNOSIS — T3995XA Adverse effect of unspecified nonopioid analgesic, antipyretic and antirheumatic, initial encounter: Secondary | ICD-10-CM

## 2018-05-10 NOTE — Progress Notes (Signed)
SLEEP MEDICINE CLINIC   Provider:  Melvyn Novas, MD   Primary Care Physician:  Clide Dales, [place]   Referring Provider: Naomie Dean, MD   Chief Complaint  Patient presents with  . New Patient (Initial Visit)    pt alone, rm 11. pt states that she snores in sleep and wakes up maybe once to use the bathroom. pt states that she has fatgue during the day. pt wakes up every morning with a headache. never had a sleep study.    HPI:  Sara Benson is a 28 y.o. female patient of Dr Trevor Mace, seen on 05-10-2018, seen here in a referral for sleep apnea.  She wakes with headaches and snores. She has HTN. She I obese. She has headaches since middle school, at the time she had her menarche.    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 Her last charge is picked up at 7 Pm, and her free time begins.  She and her 27 year old son eat at 7.30 PM, his bed time is 9 PM and hers at 10 PM. If she stays up late she has headaches at midnight, but not when in bed by 10. She takes an Exedrine migraine at bedtime. She reports hypnic headaches, and overuse medication headache.  She is asleep within minutes,  Sleeps alone in her her bedroom , on the right side, on 2 pillows. The is bedroom cool, quiet and dark. She will have one bathroom call at night, sometimes none. No palpitations, no diaphoresis,  But snoring- has been told by her son and woken herself up at times.  Wakes spontaneously 6-6.15 AM and gets her son ready for school. She has many morning a headache, nausea, and up to vomiting. No daytime naps- but dozing off after lunch , may sleep 30 minutes on week ends. She feels refreshed.      Sleep medical history :Dr Trevor Mace Interval history: She is still overusing medications.   Discussed medication overuse.  She tried the Trokendi, had side effects.  She never had the MRI or the sleep tests completed that ordered.  The migraines are in the front of the  head, pounding and throbbing, her eyes feel painful, nausea, light and sound sensitivity, a cold rag helps, she vomit. Movement makes it worse. Over the last 9 months, he has daily headaches mostly in the morning. At least 10 days ar migrainous with 3 severe migraine days a month,She also chronic sinus issues. Still waking up with headaches, positional headaches and exertion. No vision changes.  Preventative: Trokendi, maxalt, zofran, atenolol, reglan, zofran,   HPI:  Sara Benson is a 28 y.o. female here as a referral from Dr. Delford Field for Migraines. PMHx HTN.  She wakes every morning with headache. Daily excedrin, dialy tylenol, motrin, alleve. They started in middle school. She started a medication a few years ago and made her feel sick. The migraines are in the front of the head, pounding and throbbing, her eyes feel painful, nausea, light and sound sensitivity, a cold rag helps, she vomit. Movement makes it worse. Over the last 9 months, he has daily headaches mostly in the morning. At least 10 days ar migrainous with 3 severe migraine days a month, she has been to the ED and missed work. They can last over 4 hours. She take something daily. She snores, she is morbidly obese, she wakes with dry mouth, She does not have significant vision changes except with  the migraines she gets blurry vision otherwise no vision issues, No hearing changes, no pulsating in ear, ears don't feel full, No aura, no weakness, numbness or tingling.  Dr Lucia GaskinsAhern : Patient has a chronic history of headaches.  She has at least 3 severe headaches a month.  Symptoms started in middle school, diagnosed with migraines, no formal evaluation recently.  She also has a history of hypertension.  Compliant with medications.  She presented to the emergency room February 11, 2017 with head pain behind both eyes associated with nausea and vomiting.  Associated light sensitivity and sound sensitivity.  Pain was 6 out of 10.  She had been having  headaches persistently for several days.   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  Non smoker, alcohol: wine or cocktail on weekends. Caffeine - in exedrine- and sodas or iced tea daily.   Review of Systems: She reports shoulder and neck tension . She wakes up with tingling, numbness.  Out of a complete 14 system review, the patient complains of only the following symptoms, and all other reviewed systems are negative.   Snoring.  Chronic migraines, daily- OTC medication overuse for years   Epworth Sleepiness score: 11/ 24  , Fatigue severity score: 44/ 63 points.    , depression score: n/a    Social History   Socioeconomic History  . Marital status: Single    Spouse name: Not on file  . Number of children: Not on file  . Years of education: Not on file  . Highest education level: Not on file  Occupational History  . Not on file  Social Needs  . Financial resource strain: Not on file  . Food insecurity:    Worry: Not on file    Inability: Not on file  . Transportation needs:    Medical: Not on file    Non-medical: Not on file  Tobacco Use  . Smoking status: Never Smoker  . Smokeless tobacco: Never Used  Substance and Sexual Activity  . Alcohol use: Yes    Alcohol/week: 1.0 standard drinks    Types: 1 Glasses of wine per week    Comment: ocassionally  . Drug use: No  . Sexual activity: Yes    Birth control/protection: Implant  Lifestyle  . Physical activity:    Days per week: Not on file    Minutes per session: Not on file  . Stress: Not on file  Relationships  . Social connections:    Talks on phone: Not on file    Gets together: Not on file    Attends religious service: Not on file    Active member of club or organization: Not on file    Attends meetings of clubs or organizations: Not on file    Relationship status: Not on file  . Intimate partner violence:    Fear of  current or ex partner: Not on file    Emotionally abused: Not on file    Physically abused: Not on file    Forced sexual activity: Not on file  Other Topics Concern  . Not on file  Social History Narrative  . Not on file    Family History  Problem Relation Age of Onset  . Hypertension Mother   . Cancer Maternal Grandfather        BLADDER  . Cancer Paternal Grandfather        PROSTATE  .  Migraines Neg Hx     Past Medical History:  Diagnosis Date  . Cervical dysplasia   . Chlamydia 2016  . Gonorrhea 08/2016  . 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 Hypertension   . Migraines   . Normal spontaneous vaginal delivery   . Trichimoniasis     Past Surgical History:  Procedure Laterality Date  . TONSILLECTOMY AND ADENOIDECTOMY  AGE 36 (2010)    Current Outpatient Medications  Medication Sig Dispense Refill  . Erenumab-aooe (AIMOVIG) 140 MG/ML SOAJ Inject 140 mg into the skin every 30 (thirty) days. 1 pen 11  . fluticasone (FLONASE) 50 MCG/ACT nasal spray PLACE 2 SPRAYS INTO EACH NOSTRIL DAILY    . hydrochlorothiazide (HYDRODIURIL) 25 MG tablet Take 25 mg by mouth daily.    . ondansetron (ZOFRAN) 4 MG tablet Take 1 tablet (4 mg total) by mouth every 8 (eight) hours as needed for nausea or vomiting. Or headache/migraine. May take with Rizatriptan. 20 tablet 11  . rizatriptan (MAXALT) 10 MG tablet Take 1 tablet (10 mg total) by mouth as needed for migraine. May repeat in 2 hours if needed. Max 2 in one day 10 tablet 11  . Vitamin D, Ergocalciferol, (DRISDOL) 50000 units CAPS capsule Take by mouth.     Current Facility-Administered Medications  Medication Dose Route Frequency Provider Last Rate Last Dose  . cefTRIAXone (ROCEPHIN) injection 250 mg  250 mg Intramuscular Once Ok EdwardsFernandez, Juan H, MD        Allergies as of 05/10/2018  . (No Known Allergies)    Vitals: Ht 5\' 7"  (1.702 m)   Wt 232 lb (105.2 kg)   BMI 36.34 kg/m  Last Weight:  Wt Readings from Last 1  Encounters:  05/10/18 232 lb (105.2 kg)   WUJ:WJXBBMI:Body mass index is 36.34 kg/m.     Last Height:   Ht Readings from Last 1 Encounters:  05/10/18 5\' 7"  (1.702 m)   Physical exam:  General: The patient is awake, alert and appears not in acute distress. The patient is well groomed. Head: Normocephalic, atraumatic. Neck is supple. Mallampati ,   Possible enlarged thyroid.  neck circumference:16 " . Nasal airflow patent , Retrognathia is not seen.  Cardiovascular:  Regular rate and rhythm. without  murmurs or carotid bruit, and without distended neck veins. Respiratory: Lungs are clear to auscultation. Skin:  Without evidence of edema, or rash Trunk: BMI is 36.3 kg/ m2  . The patient's posture is relaxed. She reports shoulder and neck tension   Neurologic exam : The patient is awake and alert, oriented to place and time.   Speech is fluent,  without dysarthria, dysphonia or aphasia.  Mood and affect are appropriate.  Cranial nerves: Pupils are equal and briskly reactive to light. Funduscopic exam without evidence of pallor or edema. Extraocular movements  in vertical and horizontal planes intact and without nystagmus. Visual fields by finger perimetry are intact. Hearing to finger rub intact.   Facial sensation intact to fine touch.  Facial motor strength is symmetric and tongue and uvula move midline. Shoulder shrug was symmetrical.   Motor exam: Normal tone, muscle bulk and symmetric strength in all extremities.  Sensory:  Fine touch, pinprick and vibration were tested in all extremities. Proprioception tested in the upper extremities was normal.  Coordination: Rapid alternating movements in the fingers/hands was normal. Finger-to-nose maneuver  normal without evidence of ataxia, dysmetria or tremor.  Gait and station: Patient walks without assistive device- Strength within normal limits.  Stance  is stable and normal. Turns with 3 Steps.   Deep tendon reflexes: in the upper and lower  extremities are symmetric and intact.   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  She has been using Excedrin which contains caffeine for several years and has reduced caffeine daily and other forms.  Her headaches seem to not wake her up from sleep but are there when she wakes up.  There was a time when she could take an Excedrin before bedtime and would wake up without the headaches, but now it seems to not longer protect her as much.  Due to the history of migrainous headaches since sixth grade which can correlate to her menstrual cycle or onset she would be considered at a higher risk of having either hypertension or snoring related headaches in the morning or migrainous headaches in the morning.  She endorsed that she gets quite nauseated from these headaches and I think that these are very likely migraines.    She also endorsed a high degree of daytime fatigue but she is not excessively daytime sleepy endorsing the Epworth Sleepiness Scale at 11 out of 24 points.  Her risk factors are an elevated body mass index, a larger than average neck circumference, her Mallampati is a grade 4 only the soft palate but not the uvula can be seen, and there is mild retrognathia.  2)  3)   The patient was advised of the nature of the diagnosed disorder , the treatment options and the  risks for general health and wellness arising from not treating the condition.   I spent more than 45  minutes of face to face time with the patient.  Greater than 50% of time was spent in counseling and coordination of  care. We have discussed the diagnosis and differential and I answered the patient's questions.    Plan:  Treatment plan and additional workup :    HST , screening test for OSA.    Melvyn Novas, MD 05/10/2018, 11:35 AM  Certified in Neurology by ABPN Certified in Sleep Medicine by [facility] [address], [address] [place], [place] [address]

## 2018-05-10 NOTE — Patient Instructions (Signed)

## 2018-05-10 NOTE — Progress Notes (Signed)
Thank you for the wonderful care of my patients, you are phenomenal. And for always being so gracious and seeing every and all of my referrals. Thank you Dr. Vickey Huger, your opinion is worth its weight in god.

## 2018-05-19 ENCOUNTER — Ambulatory Visit: Payer: BLUE CROSS/BLUE SHIELD | Admitting: Neurology

## 2018-06-09 ENCOUNTER — Ambulatory Visit: Payer: BLUE CROSS/BLUE SHIELD | Admitting: Neurology

## 2018-06-09 ENCOUNTER — Other Ambulatory Visit: Payer: Self-pay

## 2018-06-09 DIAGNOSIS — G4733 Obstructive sleep apnea (adult) (pediatric): Secondary | ICD-10-CM | POA: Diagnosis not present

## 2018-06-15 NOTE — Procedures (Signed)
NAME:  Sara Benson                                                            DOB: 06/19/2017 MEDICAL RECORD No: [id number]                                                DOS: 06/09/2018  REFERRING PHYSICIAN: Naomie Dean, MD STUDY PERFORMED: HST on [employer] HISTORY: Sara Benson is a 28 y.o. female patient of Dr Trevor Mace, seen on 05-10-2018, in a referral for sleep apnea.  She wakes from nocturnal sleep with headaches and he snores. She has HTN, Obesity. She has headaches since middle school, onset at the time of menarche.   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 Her last charge is picked up at 7 Pm, and her free time begins.  She and her 44 year old son eat at 7.30 PM, his bed time is 9 PM and hers at 10 PM. If she stays up late she has headaches at midnight, but not when in bed by 10 PM. She takes an Exedrine migraine at bedtime every night. She reports hypnic headaches, and overuse medication headache.  She is asleep within minutes, sleeps alone in her bedroom on her right side, on 2 pillows in a bedroom that is cool, quiet and dark. She will have one bathroom call at night, sometimes none. No palpitations, no diaphoresis -but snoring- has been told by her son and woken herself up at times. She has many mornings a headache, associated with nausea, sometimes vomiting. No daytime naps- but dozing off after lunch, may sleep 30 minutes on weekends and feels refreshed.   Epworth Sleepiness score: 11/ 24 points, Fatigue severity score: 44/ 63 points. BMI: 36.3 kg/m2  STUDY RESULTS:  Total Recording Time: 8 h 31 mins; Calculated Sleep Time:  7 h 34 m. Total Apnea/Hypopnea Index (AHI):  11.3/h; RDI: 15.6 /h; REM AHI: 24.2/h. Average Oxygen Saturation:  97 %; Lowest Oxygen Desaturation: 90 %.  Total Time in Oxygen Saturation below 89 %: 0.0 minutes.  Average Heart Rate: 77 bpm (between 59 and 122 bpm). 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  RECOMMENDATION: I recommend using CPAP based on the 100% increase of apneas and hypopneas during REM sleep. I like to order autotitration CPAP with heated humidification,  a pressure window of 5-15 cm water, 3 cm EPR, and mask of patient's choice.  I certify that I have reviewed the raw data recording prior to the issuance of this report in accordance with the standards of the American Academy of Sleep Medicine (AASM). Melvyn Novas, M.D.  06-15-2018     Medical Director of [facility], accredited by the AASM. Diplomat of the ABPN and ABSM.

## 2018-06-16 ENCOUNTER — Encounter: Payer: Self-pay | Admitting: Neurology

## 2018-06-16 ENCOUNTER — Telehealth: Payer: Self-pay | Admitting: Neurology

## 2018-06-16 NOTE — Telephone Encounter (Signed)
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  RECOMMENDATION: I recommend using CPAP based on the 100% increase  of apneas and hypopneas during REM sleep. I like to order  autotitration CPAP with heated humidification, a pressure window  of 5-15 cm water, 3 cm EPR, and mask of patient's choice.

## 2018-06-16 NOTE — Telephone Encounter (Signed)
I spoke with the patient and offered the online option for downloading the savings card on [email or web address]. Her questions were answered. She verbalized appreciation and will try this.

## 2018-06-16 NOTE — Telephone Encounter (Signed)
I called pt. I advised pt that Dr. Vickey Huger reviewed their sleep study results and found that pt has sleep apnea. Dr. Vickey Huger recommends that pt auto CPAP 5-15 cm water pressure. I reviewed PAP compliance expectations with the pt. Pt is agreeable to starting a CPAP. I advised pt that an order will be sent to a DME, [facility], and [facility] will call the pt within about one week after they file with the pt's insurance. [facility] will show the pt how to use the machine, fit for masks, and troubleshoot the CPAP if needed. A follow up appt was made for insurance purposes with Shawnie Dapper, NP on May . Pt verbalized understanding to arrive 15 minutes early and bring their CPAP. A letter with all of this information in it will be mailed to the pt as a reminder. I verified with the pt that the address we have on file is correct. Pt verbalized understanding of results. Pt had no questions at this time but was encouraged to call back if questions arise. I have sent the order to [facility] and have received confirmation that they have received the order.

## 2018-06-16 NOTE — Telephone Encounter (Signed)
Pt called wanting to know if she can get another coupon for her Erenumab-aooe (AIMOVIG) 140 MG/ML SOAJ. She sates she went to the pharmacy to pick up her prescription and was informed that her previous coupon has expired. Please advise.

## 2018-07-07 ENCOUNTER — Telehealth: Payer: Self-pay | Admitting: Neurology

## 2018-07-07 NOTE — Telephone Encounter (Addendum)
Spoke with pt. Discussed setting up a telephone visit with Dr. Lucia Gaskins to discuss her concerns. She consented to the telephone visit and for our office to file the appt with her insurance, potential charges/co-pays may apply. Pt stated that she started CPAP and it is helping some but she is still having headaches and she has to take medication at night. She is trying to reduce her intake of Excedrin. D/w pt that it can take longer for improvement r/t to the CPAP however can discuss concerns with MD during visit. Per pt, best time to be reached is between 12:30-3. I scheduled pt for telephone visit Thurs 4/16 @ 1:00 pm. She verbalized appreciation.

## 2018-07-07 NOTE — Telephone Encounter (Signed)
Pt is asking for a call from RN to see if Dr Lucia Gaskins would order a CT scan for her headaches.  Please call

## 2018-07-15 ENCOUNTER — Encounter: Payer: Self-pay | Admitting: *Deleted

## 2018-07-15 ENCOUNTER — Telehealth: Payer: Self-pay | Admitting: *Deleted

## 2018-07-15 ENCOUNTER — Ambulatory Visit (INDEPENDENT_AMBULATORY_CARE_PROVIDER_SITE_OTHER): Payer: BLUE CROSS/BLUE SHIELD | Admitting: Neurology

## 2018-07-15 ENCOUNTER — Other Ambulatory Visit: Payer: Self-pay

## 2018-07-15 DIAGNOSIS — Z8249 Family history of ischemic heart disease and other diseases of the circulatory system: Secondary | ICD-10-CM | POA: Diagnosis not present

## 2018-07-15 DIAGNOSIS — R51 Headache with orthostatic component, not elsewhere classified: Secondary | ICD-10-CM

## 2018-07-15 DIAGNOSIS — H93A9 Pulsatile tinnitus, unspecified ear: Secondary | ICD-10-CM

## 2018-07-15 DIAGNOSIS — G43709 Chronic migraine without aura, not intractable, without status migrainosus: Secondary | ICD-10-CM

## 2018-07-15 DIAGNOSIS — H938X9 Other specified disorders of ear, unspecified ear: Secondary | ICD-10-CM | POA: Diagnosis not present

## 2018-07-15 DIAGNOSIS — G4484 Primary exertional headache: Secondary | ICD-10-CM

## 2018-07-15 NOTE — Progress Notes (Signed)
[facility]    Provider:  Dr Lucia GaskinsAhern Referring Provider: Clide Benson, Sara Dionne, PA Primary Care Physician:  Sara Benson, Sara Benson, [place]  CC:  Migraines  Virtual Visit via Video Note  I connected with Sara Benson on 07/20/18 at  1:00 PM EDT by a video enabled telemedicine application and verified that I am speaking with the correct person using two identifiers. Patient at home, physician in the office.    I discussed the limitations of evaluation and management by telemedicine and the availability of in person appointments. The patient expressed understanding and agreed to proceed.  Interval history 07/15/2018:  started cpap feeling better. She has a family history of cerebral aneurysms. She is having morning headaches. She feels fullness in her ear. Her paternal grandmother had a burst cerebral aneurysm and so did dad's aunt. Headaches worse with exertion, when she bends down, with sneezing. ENT referral and a CTA of the head and neck.   Interval history 02/09/2018: She is still overusing medications. Discussed medication overuse.  She tried the Trokendi, had side effects.  She never had the MRI or the sleep tests completed that ordered.  The migraines are in the front of the head, pounding and throbbing, her eyes feel painful, nausea, light and sound sensitivity, a cold rag helps, she vomit. Movement makes it worse. Over the last 9 months, he has daily headaches mostly in the morning. At least 10 days ar migrainous with 3 severe migraine days a month,She also chronic sinus issues. Still waking up with headaches, positional headaches and exertion. No vision changes.  Prventative: Trokendi, maxalt, zofran, atenolol, reglan, zofran,   HPI:  Sara Benson is a 28 y.o. female here as a referral from Dr. Delford FieldWright for Migraines. PMHx HTN.  She wakes every morning with headache. Daily excedrin, dialy tylenol, motrin, alleve. They started in middle school. She started a medication  a few years ago and made her feel sick. The migraines are in the front of the head, pounding and throbbing, her eyes feel painful, nausea, light and sound sensitivity, a cold rag helps, she vomit. Movement makes it worse. Over the last 9 months, he has daily headaches mostly in the morning. At least 10 days ar migrainous with 3 severe migraine days a month, she has been to the ED and missed work. They can last over 4 hours. She take something daily. She snores, she is morbidly obese, she wakes with dry mouth, She does not have significant vision changes except with the migraines she gets blurry vision otherwise no vision issues, No hearing changes, no pulsating in ear, ears don't feel full, No aura, no weakness, numbness or tingling.   Reviewed notes, labs and imaging from outside physicians, which showed:  Patient has a chronic history of headaches.  She has at least 3 severe headaches a month.  Symptoms started in middle school, diagnosed with migraines, no formal evaluation recently.  She also has a history of hypertension.  Compliant with medications.  She presented to the emergency room February 11, 2017 with head pain behind both eyes associated with nausea and vomiting.  Associated light sensitivity and sound sensitivity.  Pain was 6 out of 10.  She had been having headaches persistently for several days.    Review of Systems: Patient complains of symptoms per HPI as well as the following symptoms: headache, eye pain, nausea, dizziness. Pertinent negatives and positives per HPI. All others negative.   Social History   Socioeconomic History  . Marital status:  Single    Spouse name: Not on file  . Number of children: Not on file  . Years of education: Not on file  . Highest education level: Not on file  Occupational History  . Not on file  Social Needs  . Financial resource strain: Not on file  . Food insecurity:    Worry: Not on file    Inability: Not on file  . Transportation needs:     Medical: Not on file    Non-medical: Not on file  Tobacco Use  . Smoking status: Never Smoker  . Smokeless tobacco: Never Used  Substance and Sexual Activity  . Alcohol use: Yes    Alcohol/week: 1.0 standard drinks    Types: 1 Glasses of wine per week    Comment: ocassionally  . Drug use: No  . Sexual activity: Yes    Birth control/protection: Implant  Lifestyle  . Physical activity:    Days per week: Not on file    Minutes per session: Not on file  . Stress: Not on file  Relationships  . Social connections:    Talks on phone: Not on file    Gets together: Not on file    Attends religious service: Not on file    Active member of club or organization: Not on file    Attends meetings of clubs or organizations: Not on file    Relationship status: Not on file  . Intimate partner violence:    Fear of current or ex partner: Not on file    Emotionally abused: Not on file    Physically abused: Not on file    Forced sexual activity: Not on file  Other Topics Concern  . Not on file  Social History Narrative   Lives at home with her mother   Right handed   Caffeine: 1 cup "every other day"     Family History  Problem Relation Age of Onset  . Hypertension Mother   . Cancer Maternal Grandfather        BLADDER  . Cancer Paternal Grandfather        PROSTATE  . Cerebral aneurysm Father   . Cerebral aneurysm Paternal Grandmother   . Migraines Neg Hx     Past Medical History:  Diagnosis Date  . Cervical dysplasia   . Chlamydia 2016  . Gonorrhea 08/2016  . 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 Hypertension   . Migraines   . Normal spontaneous vaginal delivery   . Trichimoniasis     Past Surgical History:  Procedure Laterality Date  . TONSILLECTOMY AND ADENOIDECTOMY  AGE 31 (2010)    Current Outpatient Medications  Medication Sig Dispense Refill  . Erenumab-aooe (AIMOVIG) 140 MG/ML SOAJ Inject 140 mg into the skin every 30 (thirty) days. 1 pen 11  .  fluticasone (FLONASE) 50 MCG/ACT nasal spray PLACE 2 SPRAYS INTO EACH NOSTRIL DAILY    . hydrochlorothiazide (HYDRODIURIL) 25 MG tablet Take 25 mg by mouth daily.    . ondansetron (ZOFRAN) 4 MG tablet Take 1 tablet (4 mg total) by mouth every 8 (eight) hours as needed for nausea or vomiting. Or headache/migraine. May take with Rizatriptan. 20 tablet 11  . rizatriptan (MAXALT) 10 MG tablet Take 1 tablet (10 mg total) by mouth as needed for migraine. May repeat in 2 hours if needed. Max 2 in one day 10 tablet 11   Current Facility-Administered Medications  Medication Dose Route Frequency Provider Last Rate Last Dose  . cefTRIAXone (  ROCEPHIN) injection 250 mg  250 mg Intramuscular Once Ok Edwards, MD        Allergies as of 07/15/2018  . (No Known Allergies)    Vitals: There were no vitals taken for this visit. Last Weight:  Wt Readings from Last 1 Encounters:  07/15/18 232 lb (105.2 kg)   Last Height:   Ht Readings from Last 1 Encounters:  07/15/18  (1.676 m)   Physical exam: Prior Exam Exam: Gen: NAD, conversant, well nourised, obese, well groomed                     CV: RRR, no MRG. No Carotid Bruits. No peripheral edema, warm, nontender Eyes: Conjunctivae clear without exudates or hemorrhage  Prior Exam: Neuro:  Detailed Neurologic Exam  Speech:    Speech is normal; fluent and spontaneous with normal comprehension.  Cognition:    The patient is oriented to person, place, and time;     recent and remote memory intact;     language fluent;     normal attention, concentration,     fund of knowledge Cranial Nerves:    The pupils are equal, round, and reactive to light. The fundi are normal and spontaneous venous pulsations are present. Visual fields are full to finger confrontation. Extraocular movements are intact. Trigeminal sensation is intact and the muscles of mastication are normal. The face is symmetric. The palate elevates in the midline. Hearing intact.  Voice is normal. Shoulder shrug is normal. The tongue has normal motion without fasciculations.   Coordination:    Normal finger to nose and heel to shin. Normal rapid alternating movements.   Gait:    Heel-toe and tandem gait are normal.   Motor Observation:    No asymmetry, no atrophy, and no involuntary movements noted. Tone:    Normal muscle tone.    Posture:    Posture is normal. normal erect    Strength:    Strength is V/V in the upper and lower limbs.      Sensation: intact to LT     Reflex Exam:  DTR's:    Deep tendon reflexes in the upper and lower extremities are symmetrical bilaterally.   Toes:    The toes are downgoing bilaterally.   Clonus:    Clonus is absent.       Assessment/Plan:  Migraines, OSA, Chronic daily mornig headaches, migraines and medication overuse(rebound headache)    Pulsatile Tinnitus, exertional headaches, FHx of ruptured cerebral aneurysm(paternal grandmother and aunt), father with unruprtured cerebral aneurysm, , needs CTA of the head and neck to evaluate for aneurysm, space-occupying mass.   Migraine Preventative: Trokendi, maxalt, zofran, atenolol, reglan, zofran, Doing well on cpap and Aimovig  Sleep apnea: doing well on cpap  Suspect noncompliance in the past, now improved   Chronic maxillary sinusitis and maxillary/ear pressure: ENT referral  Orders Placed This Encounter  Procedures  . CT ANGIO HEAD W OR WO CONTRAST  . CT ANGIO NECK W OR WO CONTRAST   Meds ordered this encounter  Medications  . Erenumab-aooe (AIMOVIG) 140 MG/ML SOAJ    Sig: Inject 140 mg into the skin every 30 (thirty) days.    Dispense:  1 pen    Refill:  11     Meds ordered this encounter  Medications  . Erenumab-aooe (AIMOVIG) 140 MG/ML SOAJ    Sig: Inject 140 mg into the skin every 30 (thirty) days.    Dispense:  1 pen  Refill:  11    Follow Up Instructions:    I discussed the assessment and treatment plan with the patient. The  patient was provided an opportunity to ask questions and all were answered. The patient agreed with the plan and demonstrated an understanding of the instructions.   The patient was advised to call back or seek an in-person evaluation if the symptoms worsen or if the condition fails to improve as anticipated.  I provided 25 minutes of not in-person (video face-to-face on Webex) time during this encounter.   Anson Fret, MD  Cc: Sara Dales, [place]   Naomie Dean, MD  [facility] [address] [place], [place] [address]  Phone [phone] Fax [phone]  A total of 25 minutes was spent video face-to-face with this patient. Over half this time was spent on counseling patient on the  1. Pulsatile tinnitus   2. Sensation of fullness in ear, unspecified laterality   3. FHx: cerebral aneurysm   4. Chronic migraine without aura without status migrainosus, not intractable   5. Exertional headache   6. Positional headache    diagnosis and different diagnostic and therapeutic options, counseling and coordination of care, risks ans benefits of management, compliance, or risk factor reduction and education.

## 2018-07-15 NOTE — Telephone Encounter (Signed)
Spoke with pt. Confirmed pt using 2 identifiers. She provided review/update of chart including meds, allergies, pharmacy, etc in preparation for telephone visit today. Pt available for earlier call if needed.

## 2018-07-19 ENCOUNTER — Ambulatory Visit: Payer: Self-pay | Admitting: Neurology

## 2018-07-20 ENCOUNTER — Encounter: Payer: Self-pay | Admitting: Neurology

## 2018-07-20 MED ORDER — ERENUMAB-AOOE 140 MG/ML ~~LOC~~ SOAJ: 140.0000 mg | SUBCUTANEOUS | 11 refills | Status: DC

## 2018-07-22 ENCOUNTER — Telehealth: Payer: Self-pay | Admitting: Neurology

## 2018-07-22 NOTE — Telephone Encounter (Signed)
[employer] Auth: [id number] (exp. 07/22/18 to 01/17/19) order faxed to [facility] becasue that is who she is in network with. Their phone number is [phone] & the fax # [phone].

## 2018-07-27 ENCOUNTER — Telehealth: Payer: Self-pay | Admitting: *Deleted

## 2018-07-27 NOTE — Telephone Encounter (Signed)
Called pt to r/s appt from Friday to another day.  Due to current COVID 19 pandemic, our office is severely reducing in office visits for at least the next 2 weeks, in order to minimize the risk to our patients and healthcare providers.  Pt understands that although there may be some limitations with this type of visit, we will take all precautions to reduce any security or privacy concerns.  Pt understands that this will be treated like an in office visit and we will file with pt's insurance. Pt's email is [email or web address]. Pt understands that the [employer] webex software must be downloaded and operational on the device pt plans to use for the visit.   Pt consented to VIRTUAL VISIT, made appt 08-03-18 at 1330.

## 2018-08-02 NOTE — Telephone Encounter (Signed)
Chart updated 07-20-18 by Dr. Lucia Gaskins.

## 2018-08-03 ENCOUNTER — Other Ambulatory Visit: Payer: Self-pay

## 2018-08-03 ENCOUNTER — Ambulatory Visit: Payer: BLUE CROSS/BLUE SHIELD | Admitting: Family Medicine

## 2018-08-03 ENCOUNTER — Telehealth: Payer: Self-pay | Admitting: Family Medicine

## 2018-08-03 NOTE — Telephone Encounter (Signed)
Attempted to contact patient via Webex meeting an phone. No answer with either. Sara Benson also attempted to contact patient with no answer. I have left a message for patient to return call to reschedule. Appears that she was seen recently by Dr Lucia Gaskins for headaches so may have had this visit confused. This should be for CPAP follow up.

## 2018-08-12 ENCOUNTER — Telehealth: Payer: Self-pay | Admitting: Family Medicine

## 2018-08-12 NOTE — Telephone Encounter (Signed)
Tried to call pt to r/s her 08/03/18 apt that was missed w Shawnie Dapper NP no answer and vm was full

## 2018-08-13 ENCOUNTER — Ambulatory Visit: Payer: Self-pay | Admitting: Family Medicine

## 2018-08-17 ENCOUNTER — Telehealth: Payer: Self-pay | Admitting: Neurology

## 2018-08-17 NOTE — Telephone Encounter (Signed)
Called the patient to advise that she still had to complete a initial cpap visit and be seen by 09/25/2018 to be within the 90 day window. No answer. LVM for the pt to call back and get scheduled. Please potentially move the patient's apt up that she has it with megan and is seen before 09/25/18. Please encourage the patient to reach out to [employer] and touch base with her to help her meet compliance or let us know why she is not using the machine over 4 hrs each night.

## 2018-09-02 ENCOUNTER — Other Ambulatory Visit: Payer: Self-pay

## 2018-09-03 ENCOUNTER — Ambulatory Visit (INDEPENDENT_AMBULATORY_CARE_PROVIDER_SITE_OTHER): Payer: BLUE CROSS/BLUE SHIELD | Admitting: Obstetrics & Gynecology

## 2018-09-03 ENCOUNTER — Encounter: Payer: Self-pay | Admitting: Obstetrics & Gynecology

## 2018-09-03 VITALS — BP 146/84 | Ht 65.0 in | Wt 229.0 lb

## 2018-09-03 DIAGNOSIS — Z1151 Encounter for screening for human papillomavirus (HPV): Secondary | ICD-10-CM | POA: Diagnosis not present

## 2018-09-03 DIAGNOSIS — Z86001 Personal history of in-situ neoplasm of cervix uteri: Secondary | ICD-10-CM

## 2018-09-03 DIAGNOSIS — D069 Carcinoma in situ of cervix, unspecified: Secondary | ICD-10-CM

## 2018-09-03 DIAGNOSIS — I10 Essential (primary) hypertension: Secondary | ICD-10-CM | POA: Diagnosis not present

## 2018-09-03 DIAGNOSIS — Z3169 Encounter for other general counseling and advice on procreation: Secondary | ICD-10-CM

## 2018-09-03 DIAGNOSIS — Z113 Encounter for screening for infections with a predominantly sexual mode of transmission: Secondary | ICD-10-CM

## 2018-09-03 DIAGNOSIS — Z8619 Personal history of other infectious and parasitic diseases: Secondary | ICD-10-CM | POA: Diagnosis not present

## 2018-09-03 DIAGNOSIS — Z01419 Encounter for gynecological examination (general) (routine) without abnormal findings: Secondary | ICD-10-CM | POA: Diagnosis not present

## 2018-09-03 LAB — PREGNANCY, URINE: Preg Test, Ur: NEGATIVE

## 2018-09-03 MED ORDER — PRENATAL + DHA 27-1 & 250 MG PO THPK: 1.0000 | PACK | Freq: Every day | ORAL | 4 refills | Status: DC

## 2018-09-03 NOTE — Progress Notes (Signed)
Sara Benson December 23, 1990 [id number]   History:    28 y.o. G1P1L1 Boyfriend x 4 yrs.  Son is 27 yo.  RP:  Established patient presenting for annual gyn exam   HPI: Menstrual periods every month with normal flow.  No breakthrough bleeding.  No pelvic pain.  Not using contraception, started attempting conception last month.  History of chlamydia and severe cervical dysplasia treated in the past.  Chronic migraines.  First pregnancy without OB care, patient presented around 8 months and had to have an emergency C-section for preeclampsia.  Patient went to AICU on magnesium sulfate after delivery but had no further complications.  Breast normal.  Body mass index 38.11.  Not very physically active.  Past medical history,surgical history, family history and social history were all reviewed and documented in the EPIC chart.  Gynecologic History Patient's last menstrual period was 08/27/2018. Contraception: none Last Pap: 10/2014. Results were: Negative Last mammogram: Never Bone Density: Never Colonoscopy: Never  Obstetric History OB History  Gravida Para Term Preterm AB Living  1 1       1   SAB TAB Ectopic Multiple Live Births               # Outcome Date GA Lbr Len/2nd Weight Sex Delivery Anes PTL Lv  1 Para     M Vag-Spont        ROS: A ROS was performed and pertinent positives and negatives are included in the history.  GENERAL: No fevers or chills. HEENT: No change in vision, no earache, sore throat or sinus congestion. NECK: No pain or stiffness. CARDIOVASCULAR: No chest pain or pressure. No palpitations. PULMONARY: No shortness of breath, cough or wheeze. GASTROINTESTINAL: No abdominal pain, nausea, vomiting or diarrhea, melena or bright red blood per rectum. GENITOURINARY: No urinary frequency, urgency, hesitancy or dysuria. MUSCULOSKELETAL: No joint or muscle pain, no back pain, no recent trauma. DERMATOLOGIC: No rash, no itching, no lesions. ENDOCRINE: No polyuria, polydipsia,  no heat or cold intolerance. No recent change in weight. HEMATOLOGICAL: No anemia or easy bruising or bleeding. NEUROLOGIC: No headache, seizures, numbness, tingling or weakness. PSYCHIATRIC: No depression, no loss of interest in normal activity or change in sleep pattern.     Exam:   BP (!) 146/84   Ht 5\' 5"  (1.651 m)   Wt 229 lb (103.9 kg)   LMP 08/27/2018   BMI 38.11 kg/m   Body mass index is 38.11 kg/m.  General appearance : Well developed well nourished female. No acute distress HEENT: Eyes: no retinal hemorrhage or exudates,  Neck supple, trachea midline, no carotid bruits, no thyroidmegaly Lungs: Clear to auscultation, no rhonchi or wheezes, or rib retractions  Heart: Regular rate and rhythm, no murmurs or gallops Breast:Examined in sitting and supine position were symmetrical in appearance, no palpable masses or tenderness,  no skin retraction, no nipple inversion, no nipple discharge, no skin discoloration, no axillary or supraclavicular lymphadenopathy Abdomen: no palpable masses or tenderness, no rebound or guarding Extremities: no edema or skin discoloration or tenderness  Pelvic: Vulva: Normal             Vagina: No gross lesions or discharge  Cervix: No gross lesions or discharge.  Pap/HPV HR, Gono-Chlam done.  Uterus  AV, normal size, shape and consistency, non-tender and mobile  Adnexa  Without masses or tenderness  Anus: Normal   Assessment/Plan:  28 y.o. female for annual exam   1. Encounter for routine gynecological examination with Papanicolaou smear  of cervix Normal gynecologic exam.  Pap with high-risk HPV done today.  Breast exam normal.  2. Screen for STD (sexually transmitted disease) - Gonorrhea and chlamydia done on Pap - HIV antibody (with reflex) - RPR - Hepatitis C Antibody - Hepatitis B Surface AntiGEN  3. Encounter for preconception consultation Counseling on conception and pregnancy done.  Recommend starting on prenatal vitamins,  prescription sent to pharmacy.  Given the history of chlamydia, patient recommended to consult early after a positive home pregnancy test to rule out ectopic pregnancy.  History of preeclampsia and G1 which was a teen pregnancy with no OB care.  Risks of recurrence of preeclampsia discussed with patient.  Early OB care highly recommended.  Risks associated with obesity also discussed.  Recommend lower calorie/carb diet with increased physical activities, aerobic activities 5 times a week and weightlifting every 2 days.  4. Chronic hypertension Follow-up with PA to evaluate and manage.  5. H/O chlamydia infection Gonorrhea and chlamydia done today.  6. Severe cervical dysplasia Pap test with high-risk HPV done today.  Other orders - Prenatal-FeFum-FA-DHA w/o A (PRENATAL + DHA) 27-1 & 250 MG THPK; Take 1 tablet by mouth daily.  Counseling on above issues and coordination of care more than 50% for 15 minutes.  Genia Del MD, 12:29 PM 09/03/2018

## 2018-09-04 ENCOUNTER — Encounter: Payer: Self-pay | Admitting: Obstetrics & Gynecology

## 2018-09-04 NOTE — Patient Instructions (Signed)
1. Encounter for routine gynecological examination with Papanicolaou smear of cervix Normal gynecologic exam.  Pap with high-risk HPV done today.  Breast exam normal.  2. Screen for STD (sexually transmitted disease) - Gonorrhea and chlamydia done on Pap - HIV antibody (with reflex) - RPR - Hepatitis C Antibody - Hepatitis B Surface AntiGEN  3. Encounter for preconception consultation Counseling on conception and pregnancy done.  Recommend starting on prenatal vitamins, prescription sent to pharmacy.  Given the history of chlamydia, patient recommended to consult early after a positive home pregnancy test to rule out ectopic pregnancy.  History of preeclampsia and G1 which was a teen pregnancy with no OB care.  Risks of recurrence of preeclampsia discussed with patient.  Early OB care highly recommended.  Risks associated with obesity also discussed.  Recommend lower calorie/carb diet with increased physical activities, aerobic activities 5 times a week and weightlifting every 2 days.  4. Chronic hypertension Follow-up with PA to evaluate and manage.  5. H/O chlamydia infection Gonorrhea and chlamydia done today.  6. Severe cervical dysplasia Pap test with high-risk HPV done today.  Other orders - Prenatal-FeFum-FA-DHA w/o A (PRENATAL + DHA) 27-1 & 250 MG THPK; Take 1 tablet by mouth daily.  Sara Benson, it was a pleasure seeing you today!  I will inform you of your results as soon as they are available.

## 2018-09-06 LAB — HEPATITIS B SURFACE ANTIGEN: Hepatitis B Surface Ag: NONREACTIVE

## 2018-09-06 LAB — RPR: RPR Ser Ql: NONREACTIVE

## 2018-09-06 LAB — HEPATITIS C ANTIBODY
Hepatitis C Ab: NONREACTIVE
SIGNAL TO CUT-OFF: 0.04 (ref <1.00)

## 2018-09-06 LAB — HIV ANTIBODY (ROUTINE TESTING W REFLEX): HIV 1&2 Ab, 4th Generation: NONREACTIVE

## 2018-09-07 LAB — PAP IG, CT-NG NAA, HPV HIGH-RISK
C. trachomatis RNA, TMA: NOT DETECTED
HPV DNA High Risk: NOT DETECTED
N. gonorrhoeae RNA, TMA: NOT DETECTED

## 2018-09-21 ENCOUNTER — Telehealth: Payer: Self-pay

## 2018-09-21 NOTE — Telephone Encounter (Signed)
Any PNVs with DHA will be fine.

## 2018-09-21 NOTE — Telephone Encounter (Signed)
Called pharmacy and informed them

## 2018-09-21 NOTE — Telephone Encounter (Signed)
Re: Prenatal [place] sent a note. "What trade name is this? Cannot find a complete match in our system?"

## 2018-10-13 ENCOUNTER — Ambulatory Visit: Payer: BLUE CROSS/BLUE SHIELD | Admitting: Adult Health

## 2018-10-13 ENCOUNTER — Telehealth: Payer: Self-pay

## 2018-10-13 NOTE — Telephone Encounter (Signed)
Patient was a no call/no show for their appointment today.   

## 2018-10-20 ENCOUNTER — Telehealth: Payer: Self-pay | Admitting: *Deleted

## 2018-10-20 NOTE — Telephone Encounter (Signed)
Patient called c/o cramping off and on x 2 weeks had LMP last month, reports cramping last night was intense. But today better, check UPT it was negative, reports unprotected sex recently. She will monitor cramping, cycle due to start on 10/26/18.

## 2018-11-01 ENCOUNTER — Other Ambulatory Visit: Payer: Self-pay | Admitting: Neurology

## 2018-11-01 DIAGNOSIS — IMO0002 Reserved for concepts with insufficient information to code with codable children: Secondary | ICD-10-CM

## 2018-11-01 DIAGNOSIS — G43709 Chronic migraine without aura, not intractable, without status migrainosus: Secondary | ICD-10-CM

## 2018-11-02 ENCOUNTER — Other Ambulatory Visit: Payer: Self-pay | Admitting: Neurology

## 2018-11-02 DIAGNOSIS — G43709 Chronic migraine without aura, not intractable, without status migrainosus: Secondary | ICD-10-CM

## 2018-11-02 DIAGNOSIS — IMO0002 Reserved for concepts with insufficient information to code with codable children: Secondary | ICD-10-CM

## 2018-11-02 MED ORDER — ONDANSETRON HCL 4 MG PO TABS: 4.0000 mg | ORAL_TABLET | Freq: Three times a day (TID) | ORAL | 0 refills | Status: DC | PRN

## 2018-11-02 NOTE — Telephone Encounter (Signed)
Last prescribed 02/09/2018 with 11 refills. I called [facility] and spoke with [facility]. He stated pt's insurance pays on a 7 day supply so pt has filled it earlier and now out of refills.   Will speak with Dr. Jaynee Eagles and clarify further refills.

## 2018-11-02 NOTE — Telephone Encounter (Signed)
Rx for generic Zofran renewed x 1.

## 2018-11-02 NOTE — Telephone Encounter (Signed)
Pt is needing a refill of her ondansetron (ZOFRAN) 4 MG tablet [facility] to [facility] [address]

## 2018-11-02 NOTE — Addendum Note (Signed)
Addended by: Star Age on: 11/02/2018 01:12 PM   Modules accepted: Orders

## 2018-11-21 ENCOUNTER — Other Ambulatory Visit: Payer: Self-pay | Admitting: Neurology

## 2018-11-21 DIAGNOSIS — IMO0002 Reserved for concepts with insufficient information to code with codable children: Secondary | ICD-10-CM

## 2018-11-21 DIAGNOSIS — G43709 Chronic migraine without aura, not intractable, without status migrainosus: Secondary | ICD-10-CM

## 2018-11-23 ENCOUNTER — Telehealth: Payer: Self-pay | Admitting: Neurology

## 2018-11-23 DIAGNOSIS — G43709 Chronic migraine without aura, not intractable, without status migrainosus: Secondary | ICD-10-CM

## 2018-11-23 DIAGNOSIS — IMO0002 Reserved for concepts with insufficient information to code with codable children: Secondary | ICD-10-CM

## 2018-11-23 MED ORDER — ONDANSETRON HCL 4 MG PO TABS: 4.0000 mg | ORAL_TABLET | Freq: Three times a day (TID) | ORAL | 0 refills | Status: DC | PRN

## 2018-11-23 NOTE — Telephone Encounter (Signed)
Spoke with Dr. Jaynee Eagles. Ok to refill x 1. Pt must be seen for further refills. Note included on pt's prescription bottle and message sent to pharmacy.

## 2018-11-23 NOTE — Addendum Note (Signed)
Addended by: Gildardo Griffes on: 11/23/2018 05:23 PM   Modules accepted: Orders

## 2018-11-23 NOTE — Telephone Encounter (Signed)
Pt has called for a refill on her ondansetron (ZOFRAN) 4 MG tablet [id number]

## 2018-12-10 ENCOUNTER — Other Ambulatory Visit: Payer: Self-pay | Admitting: Neurology

## 2018-12-10 DIAGNOSIS — IMO0002 Reserved for concepts with insufficient information to code with codable children: Secondary | ICD-10-CM

## 2018-12-10 DIAGNOSIS — G43709 Chronic migraine without aura, not intractable, without status migrainosus: Secondary | ICD-10-CM

## 2019-01-03 ENCOUNTER — Telehealth: Payer: Self-pay | Admitting: Neurology

## 2019-01-03 NOTE — Telephone Encounter (Signed)
I called the pt and LVM (ok per DPR) advising Rizatriptan is limited both by insurance and d/t risk for rebound headaches, so we would not be able to prescribe it for use daily, 30 day supply is 10 tablets. Advised she shouldn't be taking this more than 2 days per week, no more than twice in one day. If she feels she needs it more often we should discuss other options. I advised her appt with Amy NP is next Wed 10/14 but asked for a call back for a sooner appt.    The Zofran refill request is too early as well. Last prescribed 12/11/2018.

## 2019-01-03 NOTE — Telephone Encounter (Signed)
Pt called in and stated she is taking the rizatriptan (MAXALT) 10 MG tablet every night and wanting to know if she can get a 30day supply of it , she is also requesting a refill of ondansetron (ZOFRAN) 4 MG tablet

## 2019-01-06 ENCOUNTER — Other Ambulatory Visit: Payer: Self-pay

## 2019-01-07 ENCOUNTER — Ambulatory Visit: Payer: BLUE CROSS/BLUE SHIELD | Admitting: Obstetrics & Gynecology

## 2019-01-07 DIAGNOSIS — Z0289 Encounter for other administrative examinations: Secondary | ICD-10-CM

## 2019-01-12 ENCOUNTER — Ambulatory Visit (INDEPENDENT_AMBULATORY_CARE_PROVIDER_SITE_OTHER): Payer: Self-pay | Admitting: Family Medicine

## 2019-01-12 ENCOUNTER — Other Ambulatory Visit: Payer: Self-pay

## 2019-01-12 ENCOUNTER — Encounter: Payer: Self-pay | Admitting: Family Medicine

## 2019-01-12 VITALS — BP 190/116 | HR 83 | Temp 97.7°F | Ht 68.0 in | Wt 230.2 lb

## 2019-01-12 DIAGNOSIS — G43709 Chronic migraine without aura, not intractable, without status migrainosus: Secondary | ICD-10-CM

## 2019-01-12 DIAGNOSIS — G4733 Obstructive sleep apnea (adult) (pediatric): Secondary | ICD-10-CM

## 2019-01-12 DIAGNOSIS — I10 Essential (primary) hypertension: Secondary | ICD-10-CM

## 2019-01-12 DIAGNOSIS — Z9989 Dependence on other enabling machines and devices: Secondary | ICD-10-CM

## 2019-01-12 DIAGNOSIS — IMO0002 Reserved for concepts with insufficient information to code with codable children: Secondary | ICD-10-CM

## 2019-01-12 NOTE — Patient Instructions (Addendum)
We are unable to contact your PCP today. My recommendation is to go to the ER. You are at increased risk for stroke or heart attack with blood pressure of 190/112.   We have to manage this BP. I am very concerned about this. Follow up with PCP immediately following ER evaluation. Please take HCTZ daily. Please stop any decongestants. Corisitin can be used OTC for sinus symptoms. Zyrtec and plain Mucinex can be used.   No more than 10 Maxalt tablets monthly, Zofran as needed   Hypertension, Adult Hypertension is another name for high blood pressure. High blood pressure forces your heart to work harder to pump blood. This can cause problems over time. There are two numbers in a blood pressure reading. There is a top number (systolic) over a bottom number (diastolic). It is best to have a blood pressure that is below 120/80. Healthy choices can help lower your blood pressure, or you may need medicine to help lower it. What are the causes? The cause of this condition is not known. Some conditions may be related to high blood pressure. What increases the risk?  Smoking.  Having type 2 diabetes mellitus, high cholesterol, or both.  Not getting enough exercise or physical activity.  Being overweight.  Having too much fat, sugar, calories, or salt (sodium) in your diet.  Drinking too much alcohol.  Having long-term (chronic) kidney disease.  Having a family history of high blood pressure.  Age. Risk increases with age.  Race. You may be at higher risk if you are African American.  Gender. Men are at higher risk than women before age 91. After age 66, women are at higher risk than men.  Having obstructive sleep apnea.  Stress. What are the signs or symptoms?  High blood pressure may not cause symptoms. Very high blood pressure (hypertensive crisis) may cause: ? Headache. ? Feelings of worry or nervousness (anxiety). ? Shortness of breath. ? Nosebleed. ? A feeling of being sick  to your stomach (nausea). ? Throwing up (vomiting). ? Changes in how you see. ? Very bad chest pain. ? Seizures. How is this treated?  This condition is treated by making healthy lifestyle changes, such as: ? Eating healthy foods. ? Exercising more. ? Drinking less alcohol.  Your health care provider may prescribe medicine if lifestyle changes are not enough to get your blood pressure under control, and if: ? Your top number is above 130. ? Your bottom number is above 80.  Your personal target blood pressure may vary. Follow these instructions at home: Eating and drinking   If told, follow the DASH eating plan. To follow this plan: ? Fill one half of your plate at each meal with fruits and vegetables. ? Fill one fourth of your plate at each meal with whole grains. Whole grains include whole-wheat pasta, brown rice, and whole-grain bread. ? Eat or drink low-fat dairy products, such as skim milk or low-fat yogurt. ? Fill one fourth of your plate at each meal with low-fat (lean) proteins. Low-fat proteins include fish, chicken without skin, eggs, beans, and tofu. ? Avoid fatty meat, cured and processed meat, or chicken with skin. ? Avoid pre-made or processed food.  Eat less than 1,500 mg of salt each day.  Do not drink alcohol if: ? Your doctor tells you not to drink. ? You are pregnant, may be pregnant, or are planning to become pregnant.  If you drink alcohol: ? Limit how much you use to:  0-1 drink  a day for women.  0-2 drinks a day for men. ? Be aware of how much alcohol is in your drink. In the [place] one drink equals one 12 oz bottle of beer (355 mL), one 5 oz glass of wine (148 mL), or one 1 oz glass of hard liquor (44 mL). Lifestyle   Work with your doctor to stay at a healthy weight or to lose weight. Ask your doctor what the best weight is for you.  Get at least 30 minutes of exercise most days of the week. This may include walking, swimming, or biking.  Get  at least 30 minutes of exercise that strengthens your muscles (resistance exercise) at least 3 days a week. This may include lifting weights or doing Pilates.  Do not use any products that contain nicotine or tobacco, such as cigarettes, e-cigarettes, and chewing tobacco. If you need help quitting, ask your doctor.  Check your blood pressure at home as told by your doctor.  Keep all follow-up visits as told by your doctor. This is important. Medicines  Take over-the-counter and prescription medicines only as told by your doctor. Follow directions carefully.  Do not skip doses of blood pressure medicine. The medicine does not work as well if you skip doses. Skipping doses also puts you at risk for problems.  Ask your doctor about side effects or reactions to medicines that you should watch for. Contact a doctor if you:  Think you are having a reaction to the medicine you are taking.  Have headaches that keep coming back (recurring).  Feel dizzy.  Have swelling in your ankles.  Have trouble with your vision. Get help right away if you:  Get a very bad headache.  Start to feel mixed up (confused).  Feel weak or numb.  Feel faint.  Have very bad pain in your: ? Chest. ? Belly (abdomen).  Throw up more than once.  Have trouble breathing. Summary  Hypertension is another name for high blood pressure.  High blood pressure forces your heart to work harder to pump blood.  For most people, a normal blood pressure is less than 120/80.  Making healthy choices can help lower blood pressure. If your blood pressure does not get lower with healthy choices, you may need to take medicine. This information is not intended to replace advice given to you by your health care provider. Make sure you discuss any questions you have with your health care provider. Document Released: 09/03/2007 Document Revised: 11/25/2017 Document Reviewed: 11/25/2017 Elsevier Patient Education  2020  [employer].

## 2019-01-12 NOTE — Progress Notes (Addendum)
PATIENT: Sara FormJasmine Lill DOB: 1990-09-24  REASON FOR VISIT: follow up HISTORY FROM: patient  Chief Complaint  Patient presents with   Follow-up    Room 1, alone.  Migraine   "Feels like she is taking a bit of excedrin and rizatriptan"     HISTORY OF PRESENT ILLNESS: Today 01/12/19 Sara Benson is a 28 y.o. female here today for follow up of migraines. Migraines are throbbing/pounding and are located in the frontal, retro orbital and maxillary region. She has light and sound sensitivity as well as nausea. She has 1-2 on average per week but this week she has had 3-4. Headaches are worse in the mornings. She has history of OSA on CPAP. She no showed last two scheduled visits for follow up. No compliance data available on Resmed. She admits that she has not used CPAP recently. She continues Amovig. She uses Maxalt and Zofran for abortive therapy. She was using Maxalt daily as well as Excedrin Migraine. She has not checked BP at home. BP is 190/121 with dynamap, 190/116 manual today. She was not taking HCTZ until about 2 weeks ago. She has had sinus pressure and congestion ongoing but worse over the past few days. She has been taking Mucinex Max with decongestant. She reports that this has helped with headaches. She feels more stressed. She is running a daycare. She has not seen PCP in quite some time. She reports feeling well today. No chest pain, trouble breathing, dizziness, or headaches. No numbness or tingling in extremities. No vision changes.   HISTORY: (copied from Dr Trevor MaceAhern's note on 07/15/2018)  Interval history 07/15/2018:  started cpap feeling better. She has a family history of cerebral aneurysms. She is having morning headaches. She feels fullness in her ear. Her paternal grandmother had a burst cerebral aneurysm and so did dad's aunt. Headaches worse with exertion, when she bends down, with sneezing. ENT referral and a CTA of the head and neck.   Interval history  02/09/2018: She is still overusing medications. Discussed medication overuse.  She tried the Trokendi, had side effects.  She never had the MRI or the sleep tests completed that ordered.  The migraines are in the front of the head, pounding and throbbing, her eyes feel painful, nausea, light and sound sensitivity, a cold rag helps, she vomit. Movement makes it worse. Over the last 9 months, he has daily headaches mostly in the morning. At least 10 days ar migrainous with 3 severe migraine days a month,She also chronic sinus issues. Still waking up with headaches, positional headaches and exertion. No vision changes.  Prventative: Trokendi, maxalt, zofran, atenolol, reglan, zofran,   HPI:  Sara FormJasmine Furgeson is a 28 y.o. female here as a referral from Dr. Delford FieldWright for Migraines. PMHx HTN.  She wakes every morning with headache. Daily excedrin, dialy tylenol, motrin, alleve. They started in middle school. She started a medication a few years ago and made her feel sick. The migraines are in the front of the head, pounding and throbbing, her eyes feel painful, nausea, light and sound sensitivity, a cold rag helps, she vomit. Movement makes it worse. Over the last 9 months, he has daily headaches mostly in the morning. At least 10 days ar migrainous with 3 severe migraine days a month, she has been to the ED and missed work. They can last over 4 hours. She take something daily. She snores, she is morbidly obese, she wakes with dry mouth, She does not have significant vision changes except  with the migraines she gets blurry vision otherwise no vision issues, No hearing changes, no pulsating in ear, ears don't feel full, No aura, no weakness, numbness or tingling.   Reviewed notes, labs and imaging from outside physicians, which showed:  Patient has a chronic history of headaches.  She has at least 3 severe headaches a month.  Symptoms started in middle school, diagnosed with migraines, no formal evaluation  recently.  She also has a history of hypertension.  Compliant with medications.  She presented to the emergency room February 11, 2017 with head pain behind both eyes associated with nausea and vomiting.  Associated light sensitivity and sound sensitivity.  Pain was 6 out of 10.  She had been having headaches persistently for several days.   REVIEW OF SYSTEMS: Out of a complete 14 system review of symptoms, the patient complains only of the following symptoms, headaches, anxiety and all other reviewed systems are negative.  ALLERGIES: No Known Allergies  HOME MEDICATIONS: Outpatient Medications Prior to Visit  Medication Sig Dispense Refill   fluticasone (FLONASE) 50 MCG/ACT nasal spray PLACE 2 SPRAYS INTO EACH NOSTRIL DAILY     hydrochlorothiazide (HYDRODIURIL) 25 MG tablet Take 25 mg by mouth daily.     ondansetron (ZOFRAN) 4 MG tablet TAKE 1 TABLET (4 MG TOTAL) BY MOUTH EVERY 8 (EIGHT) HOURS AS NEEDED FOR NAUSEA OR VOMITING. OR HEADACHE/MIGRAINE. MAY TAKE WITH RIZATRIPTAN. MUST BE SEEN FOR FURTHER REFILLS. 20 tablet 0   rizatriptan (MAXALT) 10 MG tablet Take 1 tablet (10 mg total) by mouth as needed for migraine. May repeat in 2 hours if needed. Max 2 in one day 10 tablet 11   Erenumab-aooe (AIMOVIG) 140 MG/ML SOAJ Inject 140 mg into the skin every 30 (thirty) days. 1 pen 11   Prenatal-FeFum-FA-DHA w/o A (PRENATAL + DHA) 27-1 & 250 MG THPK Take 1 tablet by mouth daily. 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   Hypertension    Migraines    Normal spontaneous vaginal delivery    Trichimoniasis     PAST SURGICAL HISTORY: Past  Surgical History:  Procedure Laterality Date   TONSILLECTOMY AND ADENOIDECTOMY  AGE 53 (2010)    FAMILY HISTORY: Family History  Problem Relation Age of Onset   Hypertension Mother    Cancer Maternal Grandfather        BLADDER   Cancer Paternal Grandfather        PROSTATE   Cerebral aneurysm Father    Cerebral aneurysm Paternal Grandmother    Migraines Neg Hx     SOCIAL HISTORY: Social History   Socioeconomic History   Marital status: Single    Spouse name: Not on file   Number of children: Not on file   Years of education: Not on file   Highest education level: Not on file  Occupational History   Not on file  Social Needs   Financial resource strain: Not on file   Food insecurity    Worry: Not on file    Inability: Not on file   Transportation needs    Medical: Not on file  Non-medical: Not on file  Tobacco Use   Smoking status: Never Smoker   Smokeless tobacco: Never Used  Substance and Sexual Activity   Alcohol use: Yes    Alcohol/week: 1.0 standard drinks    Types: 1 Glasses of wine per week    Comment: ocassionally   Drug use: No   Sexual activity: Yes    Partners: Male  Lifestyle   Physical activity    Days per week: Not on file    Minutes per session: Not on file   Stress: Not on file  Relationships   Social connections    Talks on phone: Not on file    Gets together: Not on file    Attends religious service: Not on file    Active member of club or organization: Not on file    Attends meetings of clubs or organizations: Not on file    Relationship status: Not on file   Intimate partner violence    Fear of current or ex partner: Not on file    Emotionally abused: Not on file    Physically abused: Not on file    Forced sexual activity: Not on file  Other Topics Concern   Not on file  Social History Narrative   Lives at home with her mother   Right handed   Caffeine: 1 cup "every other day"       PHYSICAL  EXAM  Vitals:   01/12/19 1323 01/12/19 1331  BP: (!) 190/121 (!) 190/116  Pulse: 83   Temp: 97.7 F (36.5 C)   Weight: 230 lb 3.2 oz (104.4 kg)   Height: 5\' 8"  (1.727 m)    Body mass index is 35 kg/m.  Generalized: Well developed, in no acute distress  Cardiology: normal rate and rhythm, no murmur noted Neurological examination  Mentation: Alert oriented to time, place, history taking. Follows all commands speech and language fluent Cranial nerve II-XII: Pupils were equal round reactive to light. Extraocular movements were full, visual field were full on confrontational test. Facial sensation and strength were normal. Uvula tongue midline. Head turning and shoulder shrug  were normal and symmetric. Motor: The motor testing reveals 5 over 5 strength of all 4 extremities. Good symmetric motor tone is noted throughout.  Sensory: Sensory testing is intact to soft touch on all 4 extremities. No evidence of extinction is noted.  Coordination: Cerebellar testing reveals good finger-nose-finger and heel-to-shin bilaterally.  Gait and station: Gait is normal. Tandem gait is normal. Romberg is negative. No drift is seen.  Reflexes: Deep tendon reflexes are symmetric and normal bilaterally.   DIAGNOSTIC DATA (LABS, IMAGING, TESTING) - I reviewed patient records, labs, notes, testing and imaging myself where available.  No flowsheet data found.   Lab Results  Component Value Date   WBC 10.4 11/27/2014   HGB 14.2 11/27/2014   HCT 42.0 11/27/2014   MCV 84.2 11/27/2014   PLT 387 11/27/2014      Component Value Date/Time   NA 141 11/27/2014 1152   K 4.3 11/27/2014 1152   CL 105 11/27/2014 1152   CO2 24 11/27/2014 1152   GLUCOSE 112 (H) 11/27/2014 1152   BUN 14 11/27/2014 1152   CREATININE 0.72 11/27/2014 1152   CALCIUM 9.3 11/27/2014 1152   PROT 7.5 11/27/2014 1152   ALBUMIN 4.4 11/27/2014 1152   AST 17 11/27/2014 1152   ALT 34 (H) 11/27/2014 1152   ALKPHOS 107 11/27/2014 1152    BILITOT 0.3 11/27/2014 1152  GFRNONAA >90 11/08/2012 0850   GFRAA >90 11/08/2012 0850   Lab Results  Component Value Date   CHOL 243 (H) 11/27/2014   Lab Results  Component Value Date   HGBA1C 5.8 (H) 11/27/2014   No results found for: [id number] Lab Results  Component Value Date   TSH 1.01 09/01/2016       ASSESSMENT AND PLAN 28 y.o. 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 here with     ICD-10-CM   1. Chronic migraine  G43.709   2. OSA on CPAP  G47.33    Z99.89     Tamela continues to have frequent headaches and migraines.  I am concerned that there are multiple factors contributing.  We have had a lengthy discussion today regarding the urgency of her blood pressure in relationship to potential migraines.  We have attempted to notify her primary care provider.  We were unsuccessful in reaching anyone at her office for further guidance.  Masayo is completely asymptomatic and feels fine in the office today.  I have discussed with her, the risk of stroke with elevated blood pressure readings.  She has tried multiple agents in the past that have been unaffected.  She is uncertain of the names.  Due to the situation at hand, I have advised that she seek attention immediately via the emergency room.  She adamantly refuses but does agreed to going straight to her primary care's office today.  She will call me as soon as possible with an update regarding plan of care.  We have discussed stroke symptoms in detail and when to seek emergency medical attention.  She verbalizes understanding of these instructions.  Asked that she stop any decongestants over-the-counter.  Coricidin advised for sinus symptoms.  We have also discussed relationship of obstructive sleep apnea and migraines.  She is currently  untreated.  Risk of untreated sleep apnea discussed.  I have advised that once blood pressure is managed, we will resume conversation regarding treatment options for obstructive sleep apnea and migraines.  We will schedule follow-up pending plan of care with PCP for hypertension.  Shivonne verbalizes understanding and agreement with this plan.   No orders of the defined types were placed in this encounter.    No orders of the defined types were placed in this encounter.     I spent 50 minutes with the patient. 50% of this time was spent counseling and educating patient on plan of care and medications.    Shawnie Dapper, FNP-C 01/12/2019, 1:32 PM [facility] [address], [address] [place], [place] [address] [phone]  Made any corrections needed, and agree with history, physical, neuro exam,assessment and plan as stated.     Naomie Dean, MD [facility]

## 2019-01-13 ENCOUNTER — Other Ambulatory Visit: Payer: Self-pay | Admitting: Family Medicine

## 2019-01-13 ENCOUNTER — Telehealth: Payer: Self-pay | Admitting: Family Medicine

## 2019-01-13 DIAGNOSIS — IMO0002 Reserved for concepts with insufficient information to code with codable children: Secondary | ICD-10-CM

## 2019-01-13 DIAGNOSIS — G43709 Chronic migraine without aura, not intractable, without status migrainosus: Secondary | ICD-10-CM

## 2019-01-13 MED ORDER — RIZATRIPTAN BENZOATE 10 MG PO TABS: 10.0000 mg | ORAL_TABLET | ORAL | 11 refills | Status: AC | PRN

## 2019-01-13 NOTE — Telephone Encounter (Signed)
Wonderful!!! Have her monitor closely. I have refilled Maxalt but remind her not to take more than 2 in a day or 10 per month. Do not take everyday.

## 2019-01-13 NOTE — Telephone Encounter (Signed)
Returned the phone call to the pt. She states she f/u with her pcp and they gave her a new medication that seems to have help her with the elevated b/p. She is unsure the name of the medication.  Patient states it has already been effective in lowering her b/p.  However she states shes out of her rebound medication rizatriptan and would lie a refill on it.

## 2019-01-13 NOTE — Telephone Encounter (Signed)
Pt called in and stated she went to see her PCP today and wants a call back to discuss visit

## 2019-01-25 ENCOUNTER — Telehealth: Payer: Self-pay

## 2019-01-25 MED ORDER — ONDANSETRON HCL 4 MG PO TABS: 4.0000 mg | ORAL_TABLET | Freq: Three times a day (TID) | ORAL | 0 refills | Status: DC | PRN

## 2019-01-25 NOTE — Addendum Note (Signed)
Addended by: Waynette Buttery on: 01/25/2019 05:08 PM   Modules accepted: Orders

## 2019-01-25 NOTE — Telephone Encounter (Signed)
Called pt to discuss how she is doing and remind her of NP Amy Lomaxs directions.   Pt states that she did not get her refill of Maxalt.  Called pt pharmacy, Pharmacy staff states that they have her medication ready for p/u.  She also wanted a refill on the Zofran.  Spoke with the N/P about the refill. She agreed

## 2019-01-25 NOTE — Telephone Encounter (Signed)
Called pt to discuss how she is doing and remind her of NP Amy Lomaxs directions.   Pt states that she did not get her refill of Maxalt.  Called pt pharmacy, Pharmacy staff states that they have her medication ready for p/u.  She also wanted a refill on the Zofran.  Spoke with the N/P about the refill. She agreed 

## 2019-02-03 ENCOUNTER — Telehealth: Payer: Self-pay

## 2019-02-03 DIAGNOSIS — N912 Amenorrhea, unspecified: Secondary | ICD-10-CM

## 2019-02-03 NOTE — Telephone Encounter (Signed)
Called patient and left message that I have order for test. Call me back and let me know when/what time she wants to come for lab appt.  Order placed.

## 2019-02-03 NOTE — Telephone Encounter (Signed)
Trying to conceive. 3 days late with menses. Home UPT is negative.  Patient would like to have blood pregnancy test. Ok to order?

## 2019-02-03 NOTE — Telephone Encounter (Signed)
Yes, agree with SPT

## 2019-02-03 NOTE — Telephone Encounter (Signed)
Patient called back. Lab appt scheduled. Covid screen reviewed. Advised to wear a mask and come alone.

## 2019-02-04 ENCOUNTER — Other Ambulatory Visit: Payer: Self-pay

## 2019-02-04 ENCOUNTER — Other Ambulatory Visit (INDEPENDENT_AMBULATORY_CARE_PROVIDER_SITE_OTHER): Payer: Self-pay

## 2019-02-04 DIAGNOSIS — N912 Amenorrhea, unspecified: Secondary | ICD-10-CM

## 2019-02-04 LAB — HCG, SERUM, QUALITATIVE: Preg, Serum: NEGATIVE

## 2019-02-07 ENCOUNTER — Telehealth: Payer: Self-pay | Admitting: *Deleted

## 2019-02-18 ENCOUNTER — Ambulatory Visit: Payer: Self-pay | Admitting: Obstetrics & Gynecology

## 2019-04-25 ENCOUNTER — Telehealth: Payer: Self-pay

## 2019-04-25 ENCOUNTER — Other Ambulatory Visit: Payer: Self-pay

## 2019-04-25 NOTE — Telephone Encounter (Signed)
Started a PA on CMM for pts Aimovig 140mg /mL Awaiting outcome.  Key: 

## 2019-04-26 ENCOUNTER — Encounter: Payer: Self-pay | Admitting: Obstetrics & Gynecology

## 2019-04-26 ENCOUNTER — Ambulatory Visit: Payer: BLUE CROSS/BLUE SHIELD | Admitting: Obstetrics & Gynecology

## 2019-04-26 VITALS — BP 120/80

## 2019-04-26 DIAGNOSIS — N979 Female infertility, unspecified: Secondary | ICD-10-CM | POA: Diagnosis not present

## 2019-04-26 DIAGNOSIS — Z8619 Personal history of other infectious and parasitic diseases: Secondary | ICD-10-CM

## 2019-04-26 NOTE — Progress Notes (Signed)
    Sara Benson 07-21-1990 [id number]        29 y.o.  G1P1L1  Boyfriend  RP: Secondary infertility x 07/2018  HPI: Menses regular normal every month.  LH urine ovulatory test positive 03/2019.  Attempting conception x 07/2018.  Timed IC every month.  Not same boyfriend as father of son who is 5 yo.  Had Gonorrhea treated in 2018.     OB History  Gravida Para Term Preterm AB Living  1 1       1   SAB TAB Ectopic Multiple Live Births               # Outcome Date GA Lbr Len/2nd Weight Sex Delivery Anes PTL Lv  1 Para     M Vag-Spont       Past medical history,surgical history, problem list, medications, allergies, family history and social history were all reviewed and documented in the EPIC chart.   Directed ROS with pertinent positives and negatives documented in the history of present illness/assessment and plan.  Exam:  Vitals:   04/26/19 1213  BP: 120/80   General appearance:  Normal  Gynecologic exam: Deferred   Assessment/Plan:  29 y.o. 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  Will rule out tubal pathology given patient's history of gonorrhea treated in 2018.  Will do HSG under Doxy after next period.  New partner for future father of baby, no paternity, no known testicular condition and no history of trauma.  We will proceed with a sperm analysis.  Follow-up as needed after completing the investigation for further management.  2. H/O gonorrhea HSG under Doxy.  Procedure reviewed with patient.  Will schedule after her next menstrual period.  Other orders - NIFEdipine (PROCARDIA-XL/NIFEDICAL-XL) 30 MG 24 hr tablet; Take by mouth.  2019 MD, 12:48 PM 04/26/2019

## 2019-04-26 NOTE — Telephone Encounter (Signed)
Received fax from [facility] for approval on the PA. Faxed over to pts pharmacy.  Effective dates: 04/25/2019-04/23/2020

## 2019-04-29 ENCOUNTER — Encounter: Payer: Self-pay | Admitting: Obstetrics & Gynecology

## 2019-04-29 ENCOUNTER — Telehealth: Payer: Self-pay | Admitting: *Deleted

## 2019-04-29 NOTE — Telephone Encounter (Signed)
I called patient and told her to call me on day 1 of cycle, if cycle falls on weekend to call on Monday. Patient asked me to hold off on sending sperm analysis until HSG done.

## 2019-04-29 NOTE — Patient Instructions (Signed)
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  Will rule out tubal pathology given patient's history of gonorrhea treated in 2018.  Will do HSG under Doxy after next period.  New partner for future father of baby, no paternity, no known testicular condition and no history of trauma.  We will proceed with a sperm analysis.  Follow-up as needed after completing the investigation for further management.  2. H/O gonorrhea HSG under Doxy.  Procedure reviewed with patient.  Will schedule after her next menstrual period.  Other orders - NIFEdipine (PROCARDIA-XL/NIFEDICAL-XL) 30 MG 24 hr tablet; Take by mouth.  Arnetra, it was a pleasure seeing you today!

## 2019-04-29 NOTE — Telephone Encounter (Signed)
-----   Message from Genia Del, MD sent at 04/26/2019 12:57 PM EST ----- Regarding: Secondary infertility Please organize Hysterosalpingography under Doxy.  Sperm analysis.

## 2019-11-15 ENCOUNTER — Ambulatory Visit: Payer: BLUE CROSS/BLUE SHIELD | Admitting: Nurse Practitioner

## 2019-11-15 DIAGNOSIS — Z0289 Encounter for other administrative examinations: Secondary | ICD-10-CM

## 2020-03-02 ENCOUNTER — Other Ambulatory Visit: Payer: Self-pay

## 2020-03-02 ENCOUNTER — Emergency Department (HOSPITAL_BASED_OUTPATIENT_CLINIC_OR_DEPARTMENT_OTHER): Payer: BLUE CROSS/BLUE SHIELD

## 2020-03-02 ENCOUNTER — Encounter (HOSPITAL_BASED_OUTPATIENT_CLINIC_OR_DEPARTMENT_OTHER): Payer: Self-pay | Admitting: *Deleted

## 2020-03-02 ENCOUNTER — Emergency Department (HOSPITAL_BASED_OUTPATIENT_CLINIC_OR_DEPARTMENT_OTHER)
Admission: EM | Admit: 2020-03-02 | Discharge: 2020-03-02 | Disposition: A | Discharge status: Home / Self Care | Payer: BLUE CROSS/BLUE SHIELD | Attending: Emergency Medicine | Admitting: Emergency Medicine

## 2020-03-02 DIAGNOSIS — I10 Essential (primary) hypertension: Secondary | ICD-10-CM | POA: Diagnosis not present

## 2020-03-02 DIAGNOSIS — R0602 Shortness of breath: Secondary | ICD-10-CM | POA: Diagnosis not present

## 2020-03-02 DIAGNOSIS — R030 Elevated blood-pressure reading, without diagnosis of hypertension: Secondary | ICD-10-CM

## 2020-03-02 DIAGNOSIS — R0789 Other chest pain: Secondary | ICD-10-CM | POA: Insufficient documentation

## 2020-03-02 DIAGNOSIS — Z79899 Other long term (current) drug therapy: Secondary | ICD-10-CM | POA: Insufficient documentation

## 2020-03-02 LAB — CBC WITH DIFFERENTIAL/PLATELET
Abs Immature Granulocytes: 0.03 10*3/uL (ref 0.00–0.07)
Basophils Absolute: 0.1 10*3/uL (ref 0.0–0.1)
Basophils Relative: 0 %
Eosinophils Absolute: 0.2 10*3/uL (ref 0.0–0.5)
Eosinophils Relative: 2 %
HCT: 41.4 % (ref 36.0–46.0)
Hemoglobin: 14.3 g/dL (ref 12.0–15.0)
Immature Granulocytes: 0 %
Lymphocytes Relative: 26 %
Lymphs Abs: 3 10*3/uL (ref 0.7–4.0)
MCH: 27.1 pg (ref 26.0–34.0)
MCHC: 34.5 g/dL (ref 30.0–36.0)
MCV: 78.6 fL — ABNORMAL LOW (ref 80.0–100.0)
Monocytes Absolute: 0.5 10*3/uL (ref 0.1–1.0)
Monocytes Relative: 5 %
Neutro Abs: 7.6 10*3/uL (ref 1.7–7.7)
Neutrophils Relative %: 67 %
Platelets: 406 10*3/uL — ABNORMAL HIGH (ref 150–400)
RBC: 5.27 MIL/uL — ABNORMAL HIGH (ref 3.87–5.11)
RDW: 12.8 % (ref 11.5–15.5)
WBC: 11.3 10*3/uL — ABNORMAL HIGH (ref 4.0–10.5)
nRBC: 0 % (ref 0.0–0.2)

## 2020-03-02 LAB — TROPONIN I (HIGH SENSITIVITY)
Troponin I (High Sensitivity): 3 ng/L (ref <18)
Troponin I (High Sensitivity): 5 ng/L (ref <18)

## 2020-03-02 LAB — COMPREHENSIVE METABOLIC PANEL
ALT: 23 U/L (ref 0–44)
AST: 18 U/L (ref 15–41)
Albumin: 4.5 g/dL (ref 3.5–5.0)
Alkaline Phosphatase: 108 U/L (ref 38–126)
Anion gap: 12 (ref 5–15)
BUN: 14 mg/dL (ref 6–20)
CO2: 24 mmol/L (ref 22–32)
Calcium: 9.1 mg/dL (ref 8.9–10.3)
Chloride: 101 mmol/L (ref 98–111)
Creatinine, Ser: 0.59 mg/dL (ref 0.44–1.00)
GFR, Estimated: >60 mL/min (ref >60)
Glucose, Bld: 112 mg/dL — ABNORMAL HIGH (ref 70–99)
Potassium: 3.4 mmol/L — ABNORMAL LOW (ref 3.5–5.1)
Sodium: 137 mmol/L (ref 135–145)
Total Bilirubin: 0.4 mg/dL (ref 0.3–1.2)
Total Protein: 8.7 g/dL — ABNORMAL HIGH (ref 6.5–8.1)

## 2020-03-02 LAB — D-DIMER, QUANTITATIVE: D-Dimer, Quant: 0.36 ug/mL-FEU (ref 0.00–0.50)

## 2020-03-02 MED ORDER — LABETALOL HCL 100 MG PO TABS
100.0000 mg | ORAL_TABLET | Freq: Once | ORAL | Status: DC
  Filled 2020-03-02: qty 1

## 2020-03-02 MED ORDER — SUCRALFATE 1 G PO TABS: 1.0000 g | ORAL_TABLET | Freq: Three times a day (TID) | ORAL | 0 refills | Status: DC | PRN

## 2020-03-02 MED ORDER — PANTOPRAZOLE SODIUM 40 MG IV SOLR
40.0000 mg | Freq: Once | INTRAVENOUS | Status: AC
  Administered 2020-03-02: 40 mg via INTRAVENOUS
  Filled 2020-03-02: qty 40

## 2020-03-02 MED ORDER — LABETALOL HCL 5 MG/ML IV SOLN
5.0000 mg | Freq: Once | INTRAVENOUS | Status: AC
  Administered 2020-03-02: 5 mg via INTRAVENOUS
  Filled 2020-03-02: qty 4

## 2020-03-02 MED ORDER — LABETALOL HCL 100 MG PO TABS: 100.0000 mg | ORAL_TABLET | Freq: Two times a day (BID) | ORAL | 0 refills | Status: AC

## 2020-03-02 NOTE — ED Provider Notes (Signed)
[facility] EMERGENCY DEPARTMENT Provider Note   CSN: [id number] Arrival date & time: 03/02/20  1325     History Chief Complaint  Patient presents with  . Chest Pain    Sara Benson is a 29 y.o. female history of obesity, hypertension, migraines. Patient presents today for left-sided chest pain onset around 11 AM this morning she was fixing lunch when it began pain is aching moderate intensity radiating up to her left shoulder, no clear inciting event or aggravating factors. Pain has waxed and waned throughout the day and has not reoccurred since patient arrived to the ER around 1:30 PM today. No medications prior to arrival. Associated with mild shortness of breath when pain is maximum.  Additionally patient is concerned about her blood pressure, she reports that she stopped taking her nifedipine 6 months ago because she felt funny taking it.  Denies headache, vision changes, neck stiffness, sore throat, cough/hemoptysis, abdominal pain, nausea/vomiting, diarrhea, diaphoresis, extremity swelling/color change, recent immobilization/surgeries, history of cancer, birth control use, family history of heart disease, smoking or any additional concerns.  HPI     Past Medical History:  Diagnosis Date  . Cervical dysplasia   . Chlamydia 2016  . Gonorrhea 08/2016  . 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 Hypertension   . Migraines   . Normal spontaneous vaginal delivery   . Trichimoniasis     Patient Active Problem List   Diagnosis Date Noted  . Chronic migraine without aura without status migrainosus, not intractable 03/06/2017  . Medication overuse headache 03/06/2017  . Trichomonas vaginalis infection 09/01/2016  . History of trichomoniasis 09/14/2012  . CIN II (cervical intraepithelial neoplasia II) 02/19/2012  . GC (gonococcus infection) 12/12/2011  . Chlamydia infection 12/12/2011  . CIN III (cervical intraepithelial neoplasia grade III) with severe  dysplasia 12/12/2011    Past Surgical History:  Procedure Laterality Date  . TONSILLECTOMY AND ADENOIDECTOMY  AGE 36 (2010)     OB History    Gravida  1   Para  1   Term      Preterm      AB      Living  1     SAB      TAB      Ectopic      Multiple      Live Births              Family History  Problem Relation Age of Onset  . Hypertension Mother   . Cancer Maternal Grandfather        BLADDER  . Cancer Paternal Grandfather        PROSTATE  . Cerebral aneurysm Father   . Cerebral aneurysm Paternal Grandmother   . Migraines Neg Hx     Social History   Tobacco Use  . Smoking status: Never Smoker  . Smokeless tobacco: Never Used  Vaping Use  . Vaping Use: Never used  Substance Use Topics  . Alcohol use: Yes    Alcohol/week: 1.0 standard drink    Types: 1 Glasses of wine per week    Comment: ocassionally  . Drug use: No    Home Medications Prior to Admission medications   Medication Sig Start Date End Date Taking? Authorizing Provider  labetalol (NORMODYNE) 100 MG tablet Take 1 tablet (100 mg total) by mouth 2 (two) times daily. 03/02/20 04/01/20  Harlene Salts A, PA-C  rizatriptan (MAXALT) 10 MG tablet Take 1 tablet (10 mg total) by mouth as needed for  migraine. May repeat in 2 hours if needed. Max 2 in one day Patient not taking: Reported on 04/26/2019 01/13/19   Lomax, Amy, NP  NIFEdipine (PROCARDIA-XL/NIFEDICAL-XL) 30 MG 24 hr tablet Take by mouth. 01/13/19 03/02/20  [provider]    Allergies    Patient has no known allergies.  Review of Systems   Review of Systems Ten systems are reviewed and are negative for acute change except as noted in the HPI  Physical Exam Updated Vital Signs BP (!) 170/101   Pulse 76   Temp 98.3 F (36.8 C) (Oral)   Resp 14   Ht 5\' 8"  (1.727 m)   Wt 104.3 kg   SpO2 100%   BMI 34.97 kg/m   Physical Exam Constitutional:      General: She is not in acute distress.    Appearance: Normal  appearance. She is well-developed. She is not ill-appearing or diaphoretic.  HENT:     Head: Normocephalic and atraumatic.  Eyes:     General: Vision grossly intact. Gaze aligned appropriately.     Pupils: Pupils are equal, round, and reactive to light.  Neck:     Trachea: Trachea and phonation normal.  Cardiovascular:     Rate and Rhythm: Normal rate and regular rhythm.  Pulmonary:     Effort: Pulmonary effort is normal. No respiratory distress.  Abdominal:     General: There is no distension.     Palpations: Abdomen is soft.     Tenderness: There is no abdominal tenderness. There is no guarding or rebound.  Musculoskeletal:        General: Normal range of motion.     Cervical back: Normal range of motion.     Right lower leg: No tenderness. No edema.     Left lower leg: No tenderness. No edema.  Skin:    General: Skin is warm and dry.  Neurological:     Mental Status: She is alert.     GCS: GCS eye subscore is 4. GCS verbal subscore is 5. GCS motor subscore is 6.     Comments: Speech is clear and goal oriented, follows commands Major Cranial nerves without deficit, no facial droop Moves extremities without ataxia, coordination intact  Psychiatric:        Behavior: Behavior normal.     ED Results / Procedures / Treatments   Labs (all labs ordered are listed, but only abnormal results are displayed) Labs Reviewed  CBC WITH DIFFERENTIAL/PLATELET - Abnormal; Notable for the following components:      Result Value   WBC 11.3 (*)    RBC 5.27 (*)    MCV 78.6 (*)    Platelets 406 (*)    All other components within normal limits  COMPREHENSIVE METABOLIC PANEL - Abnormal; Notable for the following components:   Potassium 3.4 (*)    Glucose, Bld 112 (*)    Total Protein 8.7 (*)    All other components within normal limits  D-DIMER, QUANTITATIVE (NOT AT [facility])  TROPONIN I (HIGH SENSITIVITY)  TROPONIN I (HIGH SENSITIVITY)    EKG EKG Interpretation  Date/Time:  Friday  March 02 2020 13:30:17 EST Ventricular Rate:  119 PR Interval:  132 QRS Duration: 78 QT Interval:  330 QTC Calculation: 464 R Axis:   12 Text Interpretation: Sinus tachycardia Otherwise normal ECG No significant change since last tracing No STEMI Confirmed by 12-30-1993 [phone]) on 03/02/2020 8:47:01 PM   Radiology DG Chest 2 View  Result Date: 03/02/2020  CLINICAL DATA:  Chest pain. EXAM: CHEST - 2 VIEW COMPARISON:  May 12, 2016 FINDINGS: The heart size and mediastinal contours are within normal limits. Both lungs are clear. The visualized skeletal structures are unremarkable. IMPRESSION: No active cardiopulmonary disease. Electronically Signed   By: Gerome Samavid  Williams III M.D   On: 03/02/2020 14:25    Procedures Procedures (including critical care time)  Medications Ordered in ED Medications  pantoprazole (PROTONIX) injection 40 mg (40 mg Intravenous Given 03/02/20 1919)  labetalol (NORMODYNE) injection 5 mg (5 mg Intravenous Given 03/02/20 1958)    ED Course  I have reviewed the triage vital signs and the nursing notes.  Pertinent labs & imaging results that were available during my care of the patient were reviewed by me and considered in my medical decision making (see chart for details).    MDM Rules/Calculators/A&P                         Additional history obtained from: 1. Nursing notes from this visit. 2. Review of electronic medical records ------------------ 29 year old female presented today for chest pain onset around 11 AM today while making lunch.  Chest pain has resolved prior to arrival.  She is noted be hypertensive in triage and reports that she stopped taking her nifedipine as she did not agree with her around 6 months ago.  On exam she is well-appearing no acute distress, cranial nerves intact, no meningeal signs, airway clear, cardiopulmonary exam unremarkable, abdomen soft nontender, neurovascular tact all 4 extremities without evidence of DVT.  Cannot  apply Wells/PERC criteria as patient tachycardic.  Chest pain work-up with CBC, troponin, CMP, chest x-ray and EKG ordered in triage I have added a D-dimer. - CBC shows nonspecific leukocytosis of 11.3 without left shift, no infectious symptoms doubt infection at this time.  No anemia. CMP shows no emergent electrolyte derangement, AKI, LFT elevations or gap. D-dimer is within normal limits, doubt pulmonary embolism. High-sensitivity troponin within normal limits x2 (3-5) reassuring, doubt ACS.  EKG: Sinus tachycardia Otherwise normal ECG No significant change since last tracing No STEMI Confirmed by Alona BeneLong, Joshua [phone]([id number]) on 03/02/2020 8:47:01 PM  CXR:    IMPRESSION:  No active cardiopulmonary disease.   Of note patient denies chance of pregnancy today, she reports that she finished her cycle 2 days ago.  Patient states understanding that medications given or prescribed today may result in harm to of a pregnancy and she accepts these risks and still chooses not to be pregnancy tested and proceed with medications.  - Patient's work-up today is reassuring, heart score less than 4.  Doubt ACS, PE, dissection, pneumonia, PTX or other emergent cardiopulmonary etiology patient's earlier chest pain.  May be secondary to GERD, chest pain resolved prior to my initial evaluation she is remained chest pain-free throughout ER visit.  Possible this may be secondary to GERD will start patient on antireflux medication.  Additionally patient is concerned about her blood pressure today, she has no evidence for hypertensive urgency/emergency and there is no indication for admission.  She stopped taking her nifedipine all the way she felt when she took it but is requesting a medication that would be safe in pregnancy as she reports that they are actively trying to conceive.  Patient did refuse pregnancy test in the ER today as she just finished her cycle 2 days ago, pregnancy test was offered multiple times to patient  and she refused.  Doubt pregnancy with the contributing  to her atypical chest pain in any case.  Patient was started on labetalol for blood pressure control and encouraged to call her PCP tomorrow morning to schedule follow-up appointment for blood pressure recheck and medication management this week.  At this time there does not appear to be any evidence of an acute emergency medical condition and the patient appears stable for discharge with appropriate outpatient follow up. Diagnosis was discussed with patient who verbalizes understanding of care plan and is agreeable to discharge. I have discussed return precautions with patient who verbalizes understanding. Patient encouraged to follow-up with their PCP. All questions answered.  Patient's case discussed with Dr. Jacqulyn Bath who agrees with plan to discharge with Labetalol and PCP follow-up.   Note: Portions of this report may have been transcribed using voice recognition software. Every effort was made to ensure accuracy; however, inadvertent computerized transcription errors may still be present.,mz Final Clinical Impression(s) / ED Diagnoses Final diagnoses:  Atypical chest pain  Elevated blood pressure reading    Rx / DC Orders ED Discharge Orders         Ordered    labetalol (NORMODYNE) 100 MG tablet  2 times daily        03/02/20 2052           Elizabeth Palau 03/02/20 2108    Maia Plan, MD 03/03/20 1135

## 2020-03-02 NOTE — ED Notes (Signed)
Patient transported to X-ray 

## 2020-03-02 NOTE — ED Triage Notes (Addendum)
C/o left chest pain and jaw pain x 1 day , not taking BPs

## 2020-03-02 NOTE — Discharge Instructions (Signed)
At this time there does not appear to be the presence of an emergent medical condition, however there is always the potential for conditions to change. Please read and follow the below instructions.  Please return to the Emergency Department immediately for any new or worsening symptoms. Please be sure to follow up with your Primary Care Provider within one week regarding your visit today; please call their office to schedule an appointment even if you are feeling better for a follow-up visit. You have been prescribed a new blood pressure medication today called labetalol, please take it as prescribed.  Please call your primary care doctor's office tomorrow morning to schedule a follow-up appointment for blood pressure recheck and medication management within the next week.  As we discussed is not taking nifedipine. You were also prescribed a medication which helps with heartburn, Carafate, take as prescribed and it may help with reflux related symptoms.  Go to the nearest Emergency Department immediately if: You have fever or chills Get a very bad headache. Start to feel mixed up (confused). Feel weak or numb. Feel faint. Have very bad pain in your: Chest. Belly (abdomen). Throw up more than once. Have trouble breathing. You have a cough that gets worse, or you cough up blood. You have very bad (severe) pain in your belly (abdomen). You pass out (faint). You have either of these for no clear reason: Sudden chest discomfort. Sudden discomfort in your arms, back, neck, or jaw. You have shortness of breath at any time. You suddenly start to sweat, or your skin gets clammy. You feel sick to your stomach (nauseous). You throw up (vomit). You suddenly feel lightheaded or dizzy. You feel very weak or tired. Your heart starts to beat fast, or it feels like it is skipping beats. You have any new/concerning or worsening of symptoms   Please read the additional information packets attached to  your discharge summary.  Do not take your medicine if  develop an itchy rash, swelling in your mouth or lips, or difficulty breathing; call 911 and seek immediate emergency medical attention if this occurs.  You may review your lab tests and imaging results in their entirety on your MyChart account.  Please discuss all results of fully with your primary care provider and other specialist at your follow-up visit.  Note: Portions of this text may have been transcribed using voice recognition software. Every effort was made to ensure accuracy; however, inadvertent computerized transcription errors may still be present.

## 2020-04-11 ENCOUNTER — Telehealth: Payer: Self-pay | Admitting: *Deleted

## 2020-04-11 ENCOUNTER — Encounter: Payer: Self-pay | Admitting: *Deleted

## 2020-04-11 NOTE — Telephone Encounter (Addendum)
[employer] Effective from 04/11/2020 through 04/10/2021. continuation approval. Noted AImovig is not on her med list and she doesn't have FU scheduled. I sent my chart advising she call and schedule FU.

## 2020-04-11 NOTE — Telephone Encounter (Signed)
[employer] PA, key: [id number], [id number].  Your information has been submitted to [facility] [place]. [employer] [place] will review the request and notify you of the determination decision directly, typically within 72 hours of receiving all information. If [employer] [place] has not responded within the specified timeframe or if you have any questions about your PA submission, contact [employer] [place] directly at [phone]

## 2021-02-13 NOTE — Telephone Encounter (Signed)
ERROR

## 2021-09-02 ENCOUNTER — Emergency Department (HOSPITAL_BASED_OUTPATIENT_CLINIC_OR_DEPARTMENT_OTHER): Payer: BLUE CROSS/BLUE SHIELD

## 2021-09-02 ENCOUNTER — Other Ambulatory Visit: Payer: Self-pay

## 2021-09-02 ENCOUNTER — Encounter (HOSPITAL_BASED_OUTPATIENT_CLINIC_OR_DEPARTMENT_OTHER): Payer: Self-pay | Admitting: Pediatrics

## 2021-09-02 ENCOUNTER — Emergency Department (HOSPITAL_BASED_OUTPATIENT_CLINIC_OR_DEPARTMENT_OTHER)
Admission: EM | Admit: 2021-09-02 | Discharge: 2021-09-02 | Disposition: A | Discharge status: Home / Self Care | Payer: BLUE CROSS/BLUE SHIELD | Attending: Student | Admitting: Student

## 2021-09-02 DIAGNOSIS — O368211 Fetal anemia and thrombocytopenia, first trimester, fetus 1: Secondary | ICD-10-CM | POA: Insufficient documentation

## 2021-09-02 DIAGNOSIS — Z3A00 Weeks of gestation of pregnancy not specified: Secondary | ICD-10-CM | POA: Insufficient documentation

## 2021-09-02 DIAGNOSIS — Z79899 Other long term (current) drug therapy: Secondary | ICD-10-CM | POA: Diagnosis not present

## 2021-09-02 DIAGNOSIS — O161 Unspecified maternal hypertension, first trimester: Secondary | ICD-10-CM

## 2021-09-02 DIAGNOSIS — O169 Unspecified maternal hypertension, unspecified trimester: Secondary | ICD-10-CM | POA: Insufficient documentation

## 2021-09-02 DIAGNOSIS — R0789 Other chest pain: Secondary | ICD-10-CM

## 2021-09-02 DIAGNOSIS — O26891 Other specified pregnancy related conditions, first trimester: Secondary | ICD-10-CM | POA: Diagnosis present

## 2021-09-02 LAB — URINALYSIS, MICROSCOPIC (REFLEX)

## 2021-09-02 LAB — CBC
HCT: 40.3 % (ref 36.0–46.0)
Hemoglobin: 13.7 g/dL (ref 12.0–15.0)
MCH: 26.3 pg (ref 26.0–34.0)
MCHC: 34 g/dL (ref 30.0–36.0)
MCV: 77.4 fL — ABNORMAL LOW (ref 80.0–100.0)
Platelets: 420 10*3/uL — ABNORMAL HIGH (ref 150–400)
RBC: 5.21 MIL/uL — ABNORMAL HIGH (ref 3.87–5.11)
RDW: 12.4 % (ref 11.5–15.5)
WBC: 9.2 10*3/uL (ref 4.0–10.5)
nRBC: 0 % (ref 0.0–0.2)

## 2021-09-02 LAB — URINALYSIS, ROUTINE W REFLEX MICROSCOPIC
Bilirubin Urine: NEGATIVE
Glucose, UA: NEGATIVE mg/dL
Ketones, ur: NEGATIVE mg/dL
Nitrite: NEGATIVE
Protein, ur: NEGATIVE mg/dL
Specific Gravity, Urine: 1.02 (ref 1.005–1.030)
pH: 6.5 (ref 5.0–8.0)

## 2021-09-02 LAB — BASIC METABOLIC PANEL
Anion gap: 10 (ref 5–15)
BUN: 13 mg/dL (ref 6–20)
CO2: 23 mmol/L (ref 22–32)
Calcium: 9.4 mg/dL (ref 8.9–10.3)
Chloride: 103 mmol/L (ref 98–111)
Creatinine, Ser: 0.69 mg/dL (ref 0.44–1.00)
GFR, Estimated: >60 mL/min (ref >60)
Glucose, Bld: 104 mg/dL — ABNORMAL HIGH (ref 70–99)
Potassium: 3.5 mmol/L (ref 3.5–5.1)
Sodium: 136 mmol/L (ref 135–145)

## 2021-09-02 LAB — TROPONIN I (HIGH SENSITIVITY)
Troponin I (High Sensitivity): 6 ng/L (ref <18)
Troponin I (High Sensitivity): 3 ng/L (ref <18)

## 2021-09-02 LAB — PREGNANCY, URINE: Preg Test, Ur: NEGATIVE

## 2021-09-02 NOTE — ED Notes (Signed)
Patient given discharge instructions, all questions answered. Patient in possession of all belongings, directed to the discharge area  

## 2021-09-02 NOTE — ED Notes (Signed)
Pt states that she has cp that come and goes x 1 week  felt like it was anxiety and is rx meds for such of which she took  one last Thursday but it made her  sleepy she states , also she has started fertility txs last week, has had some sob

## 2021-09-02 NOTE — Discharge Instructions (Signed)
Your blood pressure was elevated here today.  I would like for you to keep a blood pressure log and follow-up with your primary care doctor.  You may need medication adjustments.  Please return to the emergency department for any worsening symptoms like we discussed.

## 2021-09-02 NOTE — ED Provider Notes (Signed)
[facility] EMERGENCY DEPARTMENT Provider Note   CSN: [id number] Arrival date & time: 09/02/21  0856     History Chief Complaint  Patient presents with   Chest Pain   Shortness of Breath   Dizziness    Sara Benson is a 31 y.o. female with history of hypertension on labetalol who presents to the emergency department today with chest heaviness that started 1 week ago.  He states that her chest pain radiates into the bilateral arms into the right side of the neck as well intermittently.  Patient was also having some slight decrease in strength in the right arm yesterday.  She denies nausea, vomiting, diarrhea, abdominal pain, urinary symptoms.  She is never had symptoms like this in the past.   Chest Pain Associated symptoms: dizziness and shortness of breath   Shortness of Breath Associated symptoms: chest pain   Dizziness Associated symptoms: chest pain and shortness of breath       Home Medications Prior to Admission medications   Medication Sig Start Date End Date Taking? Authorizing Provider  labetalol (NORMODYNE) 100 MG tablet Take 1 tablet (100 mg total) by mouth 2 (two) times daily. 03/02/20 04/01/20  Harlene Salts A, PA-C  rizatriptan (MAXALT) 10 MG tablet Take 1 tablet (10 mg total) by mouth as needed for migraine. May repeat in 2 hours if needed. Max 2 in one day Patient not taking: Reported on 04/26/2019 01/13/19   Lomax, Amy, NP  sucralfate (CARAFATE) 1 g tablet Take 1 tablet (1 g total) by mouth 3 (three) times daily as needed. 03/02/20   Harlene Salts A, PA-C  NIFEdipine (PROCARDIA-XL/NIFEDICAL-XL) 30 MG 24 hr tablet Take by mouth. 01/13/19 03/02/20  [provider]      Allergies    Patient has no known allergies.    Review of Systems   Review of Systems  Respiratory:  Positive for shortness of breath.   Cardiovascular:  Positive for chest pain.  Neurological:  Positive for dizziness.  All other systems reviewed and are  negative.  Physical Exam Updated Vital Signs BP (!) 143/72   Pulse 82   Temp 98.1 F (36.7 C) (Oral)   Resp 17   Ht 5\' 8"  (1.727 m)   Wt 106.6 kg   LMP 08/28/2021 (Exact Date)   SpO2 99%   BMI 35.73 kg/m  Physical Exam Vitals and nursing note reviewed.  Constitutional:      General: She is not in acute distress.    Appearance: Normal appearance.  HENT:     Head: Normocephalic and atraumatic.  Eyes:     General:        Right eye: No discharge.        Left eye: No discharge.  Cardiovascular:     Comments: Regular rate and rhythm.  S1/S2 are distinct without any evidence of murmur, rubs, or gallops.  Radial pulses are 2+ bilaterally and symmetrical. No evidence of pedal edema. Pulmonary:     Comments: Clear to auscultation bilaterally.  Normal effort.  No respiratory distress.  No evidence of wheezes, rales, or rhonchi heard throughout. Abdominal:     General: Abdomen is flat. Bowel sounds are normal. There is no distension.     Tenderness: There is no abdominal tenderness. There is no guarding or rebound.  Musculoskeletal:        General: Normal range of motion.     Cervical back: Neck supple.  Skin:    General: Skin is warm and dry.  Findings: No rash.  Neurological:     General: No focal deficit present.     Mental Status: She is alert and oriented to person, place, and time.     Cranial Nerves: Cranial nerves 2-12 are intact.     Sensory: Sensation is intact.     Motor: Motor function is intact.     Comments: 5/5 strength in the upper and lower extremities.  Grip strength was normal and symmetric.  Psychiatric:        Mood and Affect: Mood normal.        Behavior: Behavior normal.    ED Results / Procedures / Treatments   Labs (all labs ordered are listed, but only abnormal results are displayed) Labs Reviewed  BASIC METABOLIC PANEL - Abnormal; Notable for the following components:      Result Value   Glucose, Bld 104 (*)    All other components within  normal limits  CBC - Abnormal; Notable for the following components:   RBC 5.21 (*)    MCV 77.4 (*)    Platelets 420 (*)    All other components within normal limits  URINALYSIS, ROUTINE W REFLEX MICROSCOPIC - Abnormal; Notable for the following components:   Hgb urine dipstick LARGE (*)    Leukocytes,Ua SMALL (*)    All other components within normal limits  URINALYSIS, MICROSCOPIC (REFLEX) - Abnormal; Notable for the following components:   Bacteria, UA RARE (*)    All other components within normal limits  PREGNANCY, URINE  TROPONIN I (HIGH SENSITIVITY)  TROPONIN I (HIGH SENSITIVITY)    EKG None  Radiology DG Chest 2 View  Result Date: 09/02/2021 CLINICAL DATA:  Chest pain and shortness of breath for the past week. EXAM: CHEST - 2 VIEW COMPARISON:  Chest x-ray dated March 02, 2020. FINDINGS: The heart size and mediastinal contours are within normal limits. Both lungs are clear. The visualized skeletal structures are unremarkable. IMPRESSION: No active cardiopulmonary disease. Electronically Signed   By: Obie Dredge M.D.   On: 09/02/2021 09:28    Procedures Procedures    Medications Ordered in ED Medications - No data to display  ED Course/ Medical Decision Making/ A&P Clinical Course as of 09/02/21 1411  Mon Sep 02, 2021  1011 CBC(!) Erythrocytosis and thrombocytosis. [CF]  1012 Pregnancy, urine Pregnancy negative [CF]  1012 Troponin I (High Sensitivity) Initial troponin normal. [CF]  1012 Basic metabolic panel(!) Elevated glucose but otherwise no abnormalities. [CF]  1012 DG Chest 2 View I personally ordered and interpreted a chest x-ray which did not show any evidence of pleural effusion or pneumonia.  I do agree with the radiologist interpretation. [CF]  1348 Troponin I (High Sensitivity) Delta troponin is negative. [CF]  1411 Urinalysis, Routine w reflex microscopic Urine, Clean Catch(!) Urinalysis does not show any signs of infection.  There are small  amount of leukocytes and bacteria however there is a large amount of epithelial cells syncope-like this is a contaminated sample. [CF]    Clinical Course User Index [CF] Teressa Lower, PA-C                           Medical Decision Making Sara Benson is a 31 y.o. female patient who presents to the emergency department today for further evaluation of chest heaviness, shortness of breath.  Differential diagnosis includes ACS, viral process.  I have a low suspicion for pulmonary embolism or dissection at this time.  Patient  has symmetrical and 2+ pulses.  No evidence of hypoxia or tachycardia to indicate pulmonary embolism nor does she have any significant risk factors.  We will get labs, imaging, and EKG.   Amount and/or Complexity of Data Reviewed External Data Reviewed: notes.    Details: Patient was seen evaluated at the fertility clinic when she had an insemination done. Labs: ordered. Decision-making details documented in ED Course. Radiology: ordered and independent interpretation performed. Decision-making details documented in ED Course. ECG/medicine tests: ordered and independent interpretation performed.    Details: Normal sinus rhythm without any evidence of acute ST changes.  Risk Prescription drug management. Parenteral controlled substances. Risk Details: On reevaluation, patient states that her pain has significantly improved.  Blood pressure is elevated here consistently.  Patient did take her antihypertensives this morning.  We will have her keep a blood pressure log and have her follow-up with her PCP for further evaluation.  Her work-up here is reassuring and patient had negative troponins x2.  EKG is within normal limits.  Heart score of 1.  Given the clinical scenario, I do feel the patient would likely benefit from further evaluation with her primary care provider.  Strict return precautions were discussed with the patient at the bedside.  She is safe for discharge  at this time.   Final Clinical Impression(s) / ED Diagnoses Final diagnoses:  Elevated blood pressure affecting pregnancy in first trimester, antepartum  Other chest pain    Rx / DC Orders ED Discharge Orders     None         Teressa LowerFleming, Aashika Carta M, [place]PA-C 09/02/21 1411    Glendora ScoreKommor, [place], MD 09/02/21 [phone]

## 2021-09-02 NOTE — ED Triage Notes (Signed)
C/O chest pain, shortness of breathe along w/ dizziness a week ago; reported intermittent neck and arm pain as well. Hx of HTN;

## 2021-09-12 ENCOUNTER — Emergency Department (HOSPITAL_BASED_OUTPATIENT_CLINIC_OR_DEPARTMENT_OTHER): Payer: BLUE CROSS/BLUE SHIELD

## 2021-09-12 ENCOUNTER — Emergency Department (HOSPITAL_BASED_OUTPATIENT_CLINIC_OR_DEPARTMENT_OTHER)
Admission: EM | Admit: 2021-09-12 | Discharge: 2021-09-12 | Disposition: A | Discharge status: Home / Self Care | Payer: BLUE CROSS/BLUE SHIELD | Attending: Emergency Medicine | Admitting: Emergency Medicine

## 2021-09-12 ENCOUNTER — Encounter (HOSPITAL_BASED_OUTPATIENT_CLINIC_OR_DEPARTMENT_OTHER): Payer: Self-pay | Admitting: Emergency Medicine

## 2021-09-12 DIAGNOSIS — R0789 Other chest pain: Secondary | ICD-10-CM | POA: Diagnosis present

## 2021-09-12 DIAGNOSIS — Z79899 Other long term (current) drug therapy: Secondary | ICD-10-CM | POA: Diagnosis not present

## 2021-09-12 DIAGNOSIS — I10 Essential (primary) hypertension: Secondary | ICD-10-CM | POA: Diagnosis not present

## 2021-09-12 DIAGNOSIS — R0602 Shortness of breath: Secondary | ICD-10-CM | POA: Diagnosis not present

## 2021-09-12 LAB — BASIC METABOLIC PANEL
Anion gap: 8 (ref 5–15)
BUN: 15 mg/dL (ref 6–20)
CO2: 25 mmol/L (ref 22–32)
Calcium: 9.3 mg/dL (ref 8.9–10.3)
Chloride: 105 mmol/L (ref 98–111)
Creatinine, Ser: 0.74 mg/dL (ref 0.44–1.00)
GFR, Estimated: >60 mL/min (ref >60)
Glucose, Bld: 114 mg/dL — ABNORMAL HIGH (ref 70–99)
Potassium: 3.7 mmol/L (ref 3.5–5.1)
Sodium: 138 mmol/L (ref 135–145)

## 2021-09-12 LAB — CBC
HCT: 38.1 % (ref 36.0–46.0)
Hemoglobin: 13.1 g/dL (ref 12.0–15.0)
MCH: 26.8 pg (ref 26.0–34.0)
MCHC: 34.4 g/dL (ref 30.0–36.0)
MCV: 77.9 fL — ABNORMAL LOW (ref 80.0–100.0)
Platelets: 377 10*3/uL (ref 150–400)
RBC: 4.89 MIL/uL (ref 3.87–5.11)
RDW: 13 % (ref 11.5–15.5)
WBC: 12.4 10*3/uL — ABNORMAL HIGH (ref 4.0–10.5)
nRBC: 0 % (ref 0.0–0.2)

## 2021-09-12 LAB — TROPONIN I (HIGH SENSITIVITY): Troponin I (High Sensitivity): 4 ng/L (ref <18)

## 2021-09-12 LAB — PREGNANCY, URINE: Preg Test, Ur: NEGATIVE

## 2021-09-12 MED ORDER — SUCRALFATE 1 G PO TABS: 1.0000 g | ORAL_TABLET | Freq: Three times a day (TID) | ORAL | 0 refills | Status: AC

## 2021-09-12 NOTE — ED Provider Notes (Signed)
[facility] EMERGENCY DEPARTMENT Provider Note   CSN: [id number] Arrival date & time: 09/12/21  2012     History  Chief Complaint  Patient presents with   Chest Pain    Sara Benson is a 31 y.o. female.  Patient is a 31 year old female with history of hypertension, migraines, GERD.  Patient presenting today for evaluation of chest discomfort.  This has been ongoing intermittently for the past 3 weeks.  She has been seen here previously with similar complaints with negative work-up.  She has been referred to cardiology and this appointment is to happen in the next 2 weeks.  This evening, her symptoms worsen.  She describes a crampy pain to the left upper chest that makes her feel short of breath, but with no associated nausea, diaphoresis, or radiation.  She denies any exertional symptoms.  She has no prior cardiac history.  The history is provided by the patient.       Home Medications Prior to Admission medications   Medication Sig Start Date End Date Taking? Authorizing Provider  labetalol (NORMODYNE) 100 MG tablet Take 1 tablet (100 mg total) by mouth 2 (two) times daily. 03/02/20 04/01/20  Harlene Salts A, PA-C  rizatriptan (MAXALT) 10 MG tablet Take 1 tablet (10 mg total) by mouth as needed for migraine. May repeat in 2 hours if needed. Max 2 in one day Patient not taking: Reported on 04/26/2019 01/13/19   Lomax, Amy, NP  sucralfate (CARAFATE) 1 g tablet Take 1 tablet (1 g total) by mouth 3 (three) times daily as needed. 03/02/20   Harlene Salts A, PA-C  NIFEdipine (PROCARDIA-XL/NIFEDICAL-XL) 30 MG 24 hr tablet Take by mouth. 01/13/19 03/02/20  [provider]      Allergies    Patient has no known allergies.    Review of Systems   Review of Systems  All other systems reviewed and are negative.   Physical Exam Updated Vital Signs BP (!) 139/97   Pulse 77   Temp 98.5 F (36.9 C) (Oral)   Resp 18   Ht 5\' 6"  (1.676 m)   Wt 102.1 kg   LMP  08/28/2021 (Exact Date)   SpO2 100%   BMI 36.32 kg/m  Physical Exam Vitals and nursing note reviewed.  Constitutional:      General: She is not in acute distress.    Appearance: She is well-developed. She is not diaphoretic.  HENT:     Head: Normocephalic and atraumatic.  Cardiovascular:     Rate and Rhythm: Normal rate and regular rhythm.     Heart sounds: No murmur heard.    No friction rub. No gallop.  Pulmonary:     Effort: Pulmonary effort is normal. No respiratory distress.     Breath sounds: Normal breath sounds. No wheezing.  Abdominal:     General: Bowel sounds are normal. There is no distension.     Palpations: Abdomen is soft.     Tenderness: There is no abdominal tenderness.  Musculoskeletal:        General: Normal range of motion.     Cervical back: Normal range of motion and neck supple.     Right lower leg: No tenderness. No edema.     Left lower leg: No tenderness. No edema.  Skin:    General: Skin is warm and dry.  Neurological:     General: No focal deficit present.     Mental Status: She is alert and oriented to person, place, and time.  ED Results / Procedures / Treatments   Labs (all labs ordered are listed, but only abnormal results are displayed) Labs Reviewed  BASIC METABOLIC PANEL - Abnormal; Notable for the following components:      Result Value   Glucose, Bld 114 (*)    All other components within normal limits  CBC - Abnormal; Notable for the following components:   WBC 12.4 (*)    MCV 77.9 (*)    All other components within normal limits  PREGNANCY, URINE  TROPONIN I (HIGH SENSITIVITY)  TROPONIN I (HIGH SENSITIVITY)    EKG EKG Interpretation  Date/Time:  Thursday September 12 2021 20:27:39 EDT Ventricular Rate:  82 PR Interval:  126 QRS Duration: 86 QT Interval:  338 QTC Calculation: 394 R Axis:   20 Text Interpretation: Normal sinus rhythm Normal ECG When compared with ECG of 02-Sep-2021 09:13,no change is noted Confirmed by  Veryl Speak [phone]) on 09/12/2021 11:01:23 PM  Radiology DG Chest 2 View  Result Date: 09/12/2021 CLINICAL DATA:  Chest pain. EXAM: CHEST - 2 VIEW COMPARISON:  Chest x-ray 09/02/2021 FINDINGS: The heart size and mediastinal contours are within normal limits. Both lungs are clear. The visualized skeletal structures are unremarkable. IMPRESSION: No active cardiopulmonary disease. Electronically Signed   By: Ronney Asters M.D.   On: 09/12/2021 20:52    Procedures Procedures    Medications Ordered in ED Medications - No data to display  ED Course/ Medical Decision Making/ A&P  This patient presents to the ED for concern of chest discomfort, this involves an extensive number of treatment options, and is a complaint that carries with it a high risk of complications and morbidity.  The differential diagnosis includes acute coronary syndrome, pulmonary embolism, aortic dissection, musculoskeletal, GERD   Co morbidities that complicate the patient evaluation  None   Additional history obtained:  No additional history or external records needed   Lab Tests:  I Ordered, and personally interpreted labs.  The pertinent results include: Unremarkable CBC, metabolic panel, and troponin   Imaging Studies ordered:  I ordered imaging studies including chest x-ray I independently visualized and interpreted imaging which showed no acute process I agree with the radiologist interpretation   Cardiac Monitoring: / EKG:  The patient was maintained on a cardiac monitor.  I personally viewed and interpreted the cardiac monitored which showed an underlying rhythm of: Sinus   Consultations Obtained:  No consultations obtained or needed   Problem List / ED Course / Critical interventions / Medication management  Patient presenting with complaints of chest discomfort as described in the HPI.  Her symptoms are atypical for cardiac pain and work-up is unremarkable.  Vitals are stable.  I have  little suspicion for pulmonary embolism, aortic dissection, or other acute situation.  Patient is concerned that this may be reflux.  She reports taking Prilosec 20 mg twice daily.  We will try starting her on Carafate and see if this helps.  I suspect symptoms may be musculoskeletal in nature as she reports repetitive lifting throughout the day.  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   No medications ordered or given here in the ER I have reviewed the patients home medicines and have made adjustments as needed   Social Determinants of Health:  None   Test / Admission - Considered:  Patient with negative work-up and seems appropriate for discharge with outpatient follow-up with cardiology as scheduled   Final Clinical Impression(s) / ED Diagnoses Final  diagnoses:  None    Rx / DC Orders ED Discharge Orders     None         Geoffery Lyons, MD 09/12/21 2340

## 2021-09-12 NOTE — ED Triage Notes (Signed)
Chest pain upper left, radiates to right side. X 3 weeks with some SOB was seen here for same.

## 2021-09-12 NOTE — Discharge Instructions (Addendum)
Begin taking Carafate as prescribed.  Continue other medications as previously prescribed.  Begin taking ibuprofen 600 mg 3 times daily for the next week.  Follow-up with cardiology as scheduled, and return to the ER if symptoms significantly worsen or change.

## 2022-05-25 ENCOUNTER — Emergency Department (HOSPITAL_BASED_OUTPATIENT_CLINIC_OR_DEPARTMENT_OTHER)
Admission: EM | Admit: 2022-05-25 | Discharge: 2022-05-25 | Disposition: A | Discharge status: Home / Self Care | Payer: BLUE CROSS/BLUE SHIELD | Attending: Emergency Medicine | Admitting: Emergency Medicine

## 2022-05-25 ENCOUNTER — Other Ambulatory Visit: Payer: Self-pay

## 2022-05-25 ENCOUNTER — Encounter (HOSPITAL_BASED_OUTPATIENT_CLINIC_OR_DEPARTMENT_OTHER): Payer: Self-pay | Admitting: Emergency Medicine

## 2022-05-25 ENCOUNTER — Emergency Department (HOSPITAL_BASED_OUTPATIENT_CLINIC_OR_DEPARTMENT_OTHER): Payer: BLUE CROSS/BLUE SHIELD

## 2022-05-25 DIAGNOSIS — M542 Cervicalgia: Secondary | ICD-10-CM | POA: Diagnosis not present

## 2022-05-25 DIAGNOSIS — R519 Headache, unspecified: Secondary | ICD-10-CM | POA: Diagnosis present

## 2022-05-25 MED ORDER — CYCLOBENZAPRINE HCL 10 MG PO TABS: 10.0000 mg | ORAL_TABLET | Freq: Two times a day (BID) | ORAL | 0 refills | Status: AC | PRN

## 2022-05-25 MED ORDER — KETOROLAC TROMETHAMINE 60 MG/2ML IM SOLN
60.0000 mg | Freq: Once | INTRAMUSCULAR | Status: AC
  Administered 2022-05-25: 60 mg via INTRAMUSCULAR
  Filled 2022-05-25: qty 2

## 2022-05-25 NOTE — ED Triage Notes (Addendum)
Pt c/o headache for the past week. Pt started having neck pain 3 days ago that is causing numbness and tingling to extremities. Pt takes amlodipine for blood pressure and is supposed to take labetalol as well, but has not been taking it as prescribed.

## 2022-05-25 NOTE — ED Provider Notes (Signed)
[facility] Provider Note   CSN: [id number] Arrival date & time: 05/25/22  [id number]     History  Chief Complaint  Patient presents with   Neck Pain   Headache    Sara Benson is a 32 y.o. female.  Patient here with headache and neck pain for the last couple days.  She says that on and off she will have some tingling in her hands and her legs.  She has history of migraines.  Headache has been on and off as well.  Not the worst headache of her life.  Headache has come on gradually at times and then gone away.  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  She denies any trauma.  Denies any weakness, vision loss, shortness of breath, chest pain.  She would like a head CT because it has been a while since she has had had any head imaging.  She is to follow with neurology.  She has had MRIs in the past.  No history of MS or demyelinating disease with her or the family.  Right now she is not really having any paresthesias or tingling or weakness or visual loss.  Headache, neck pain fairly mild at this time.  Denies any fevers or chills.  The history is provided by the patient.       Home Medications Prior to Admission medications   Medication Sig Start Date End Date Taking? Authorizing Provider  cyclobenzaprine (FLEXERIL) 10 MG tablet Take 1 tablet (10 mg total) by mouth 2 (two) times daily as needed for muscle spasms. 05/25/22  Yes Areana Kosanke, DO  labetalol (NORMODYNE) 100 MG tablet Take 1 tablet (100 mg total) by mouth 2 (two) times daily. 03/02/20 04/01/20  Nuala Alpha A, PA-C  rizatriptan (MAXALT) 10 MG tablet Take 1 tablet (10 mg total) by mouth as needed for migraine. May repeat in 2 hours if needed. Max 2 in one day Patient not taking: Reported on 04/26/2019 01/13/19   Lomax, Amy, NP  sucralfate (CARAFATE) 1 g tablet Take 1 tablet (1 g total) by mouth 4 (four) times daily -  with meals and at bedtime. 09/12/21   Veryl Speak, MD  NIFEdipine (PROCARDIA-XL/NIFEDICAL-XL) 30 MG 24 hr tablet Take by mouth. 01/13/19 03/02/20  [provider]      Allergies    Patient has no known allergies.    Review of Systems   Review of Systems  Physical Exam Updated Vital Signs BP (!) 155/106 (BP Location: Right Arm)   Pulse 91   Temp 99 F (37.2 C) (Oral)   Resp 16   Ht '5\' 6"'$  (1.676 m)   Wt 104.3 kg   LMP 05/10/2022 (Exact Date)   SpO2 99%   BMI 37.12 kg/m  Physical Exam Vitals and nursing note reviewed.  Constitutional:      General: She is not in acute distress.    Appearance: She is well-developed. She is not ill-appearing.  HENT:     Head: Normocephalic and atraumatic.     Right Ear: Tympanic membrane normal.     Left Ear: Tympanic membrane normal.     Nose: Nose normal.     Mouth/Throat:     Mouth: Mucous membranes are moist.  Eyes:     Extraocular Movements: Extraocular movements intact.     Conjunctiva/sclera: Conjunctivae normal.     Pupils: Pupils are equal, round, and reactive to light.  Cardiovascular:  Rate and Rhythm: Normal rate and regular rhythm.     Pulses: Normal pulses.     Heart sounds: Normal heart sounds. No murmur heard. Pulmonary:     Effort: Pulmonary effort is normal. No respiratory distress.     Breath sounds: Normal breath sounds.  Abdominal:     General: Abdomen is flat.     Palpations: Abdomen is soft.     Tenderness: There is no abdominal tenderness.  Musculoskeletal:        General: No swelling or tenderness. Normal range of motion.     Cervical back: Normal range of motion and neck supple. No rigidity.     Comments: No midline spinal tenderness  Skin:    General: Skin is warm and dry.     Capillary Refill: Capillary refill takes less than 2 seconds.  Neurological:     General: No focal deficit present.     Mental Status: She is alert and oriented to person, place, and time.     Cranial Nerves: No cranial nerve deficit.     Sensory: No sensory  deficit.     Motor: No weakness.     Coordination: Coordination normal.     Gait: Gait normal.     Comments: 5+ out of 5 strength throughout, normal sensation, no drift, normal finger-nose-finger, normal speech  Psychiatric:        Mood and Affect: Mood normal.     ED Results / Procedures / Treatments   Labs (all labs ordered are listed, but only abnormal results are displayed) Labs Reviewed - No data to display  EKG None  Radiology CT Head Wo Contrast  Result Date: 05/25/2022 CLINICAL DATA:  32 year old female with sudden onset of severe headache. EXAM: CT HEAD WITHOUT CONTRAST TECHNIQUE: Contiguous axial images were obtained from the base of the skull through the vertex without intravenous contrast. RADIATION DOSE REDUCTION: This exam was performed according to the departmental dose-optimization program which includes automated exposure control, adjustment of the mA and/or kV according to patient size and/or use of iterative reconstruction technique. COMPARISON:  Head CT 02/11/2017. FINDINGS: Brain: No evidence of acute infarction, hemorrhage, hydrocephalus, extra-axial collection or mass lesion/mass effect. Vascular: No hyperdense vessel or unexpected calcification. Skull: Normal. Negative for fracture or focal lesion. Sinuses/Orbits: No acute finding. Other: None. IMPRESSION: 1. No acute intracranial abnormalities. The appearance of the brain is normal. Electronically Signed   By: Vinnie Langton M.D.   On: 05/25/2022 08:09    Procedures Procedures    Medications Ordered in ED Medications - No data to display  ED Course/ Medical Decision Making/ A&P                             Medical Decision Making Amount and/or Complexity of Data Reviewed Radiology: ordered.  Risk Prescription drug management.   Sara Benson is here with headache and neck pain.  Normal vitals.  No fever.  History of migraines.  Differential diagnosis likely tension headache/migraine type  headache.  She would like a head CT which I think is reasonable since it has been a long time since she has had any head imaging.  This seems like this could be a muscular process/stress related process.  Her neurological exam is normal.  She has no numbness or weakness or vision changes.  I have no concern for stroke with subarachnoid hemorrhage or meningitis.  Head CT was done and per radiology report shows no acute  findings.  Ultimately patient was given an IM shot of Toradol will prescribe her Flexeril as I suspect may be there is a muscular/tension component as well.  Will have her follow-up with neurology as this could be complex migraines or some other chronic neurologic issues seems less likely to be MS but could be reasonable to have discussion with her about that..  Discharged in good condition.  This chart was dictated using voice recognition software.  Despite best efforts to proofread,  errors can occur which can change the documentation meaning.         Final Clinical Impression(s) / ED Diagnoses Final diagnoses:  Bad headache  Neck pain    Rx / DC Orders ED Discharge Orders          Ordered    cyclobenzaprine (FLEXERIL) 10 MG tablet  2 times daily PRN        05/25/22 0812    Ambulatory referral to Neurology       Comments: An appointment is requested in approximately: 2 weeks   05/25/22 0813              Lennice Sites, DO 05/25/22 [id number]

## 2022-10-02 IMAGING — CR DG CHEST 2V
2 series · 2 of 2 positions shown · non-contrast
Comparison: Chest x-ray 09/02/2021

CLINICAL DATA: Chest pain.

EXAM:
CHEST - 2 VIEW

[w chest pa]
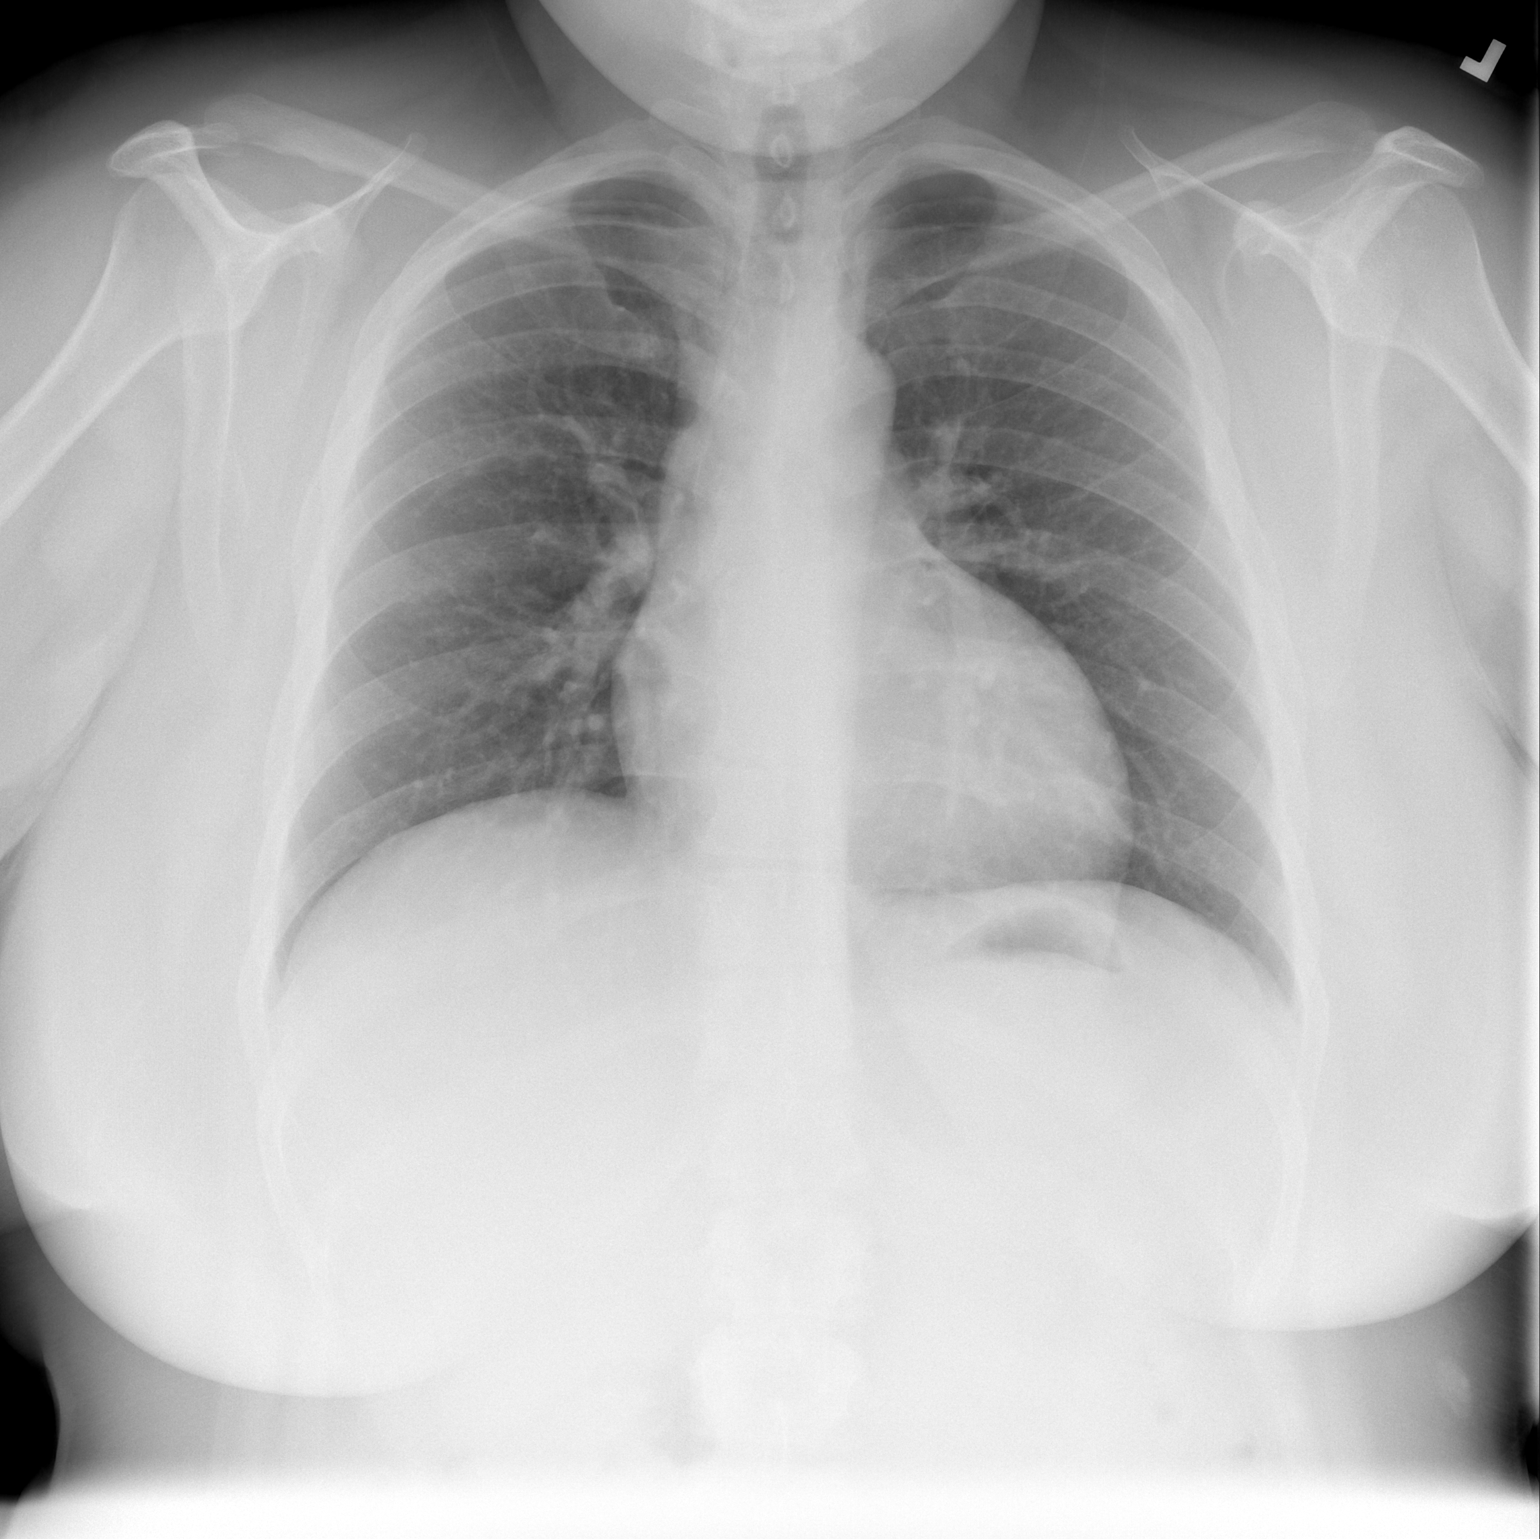

[w chest lat]
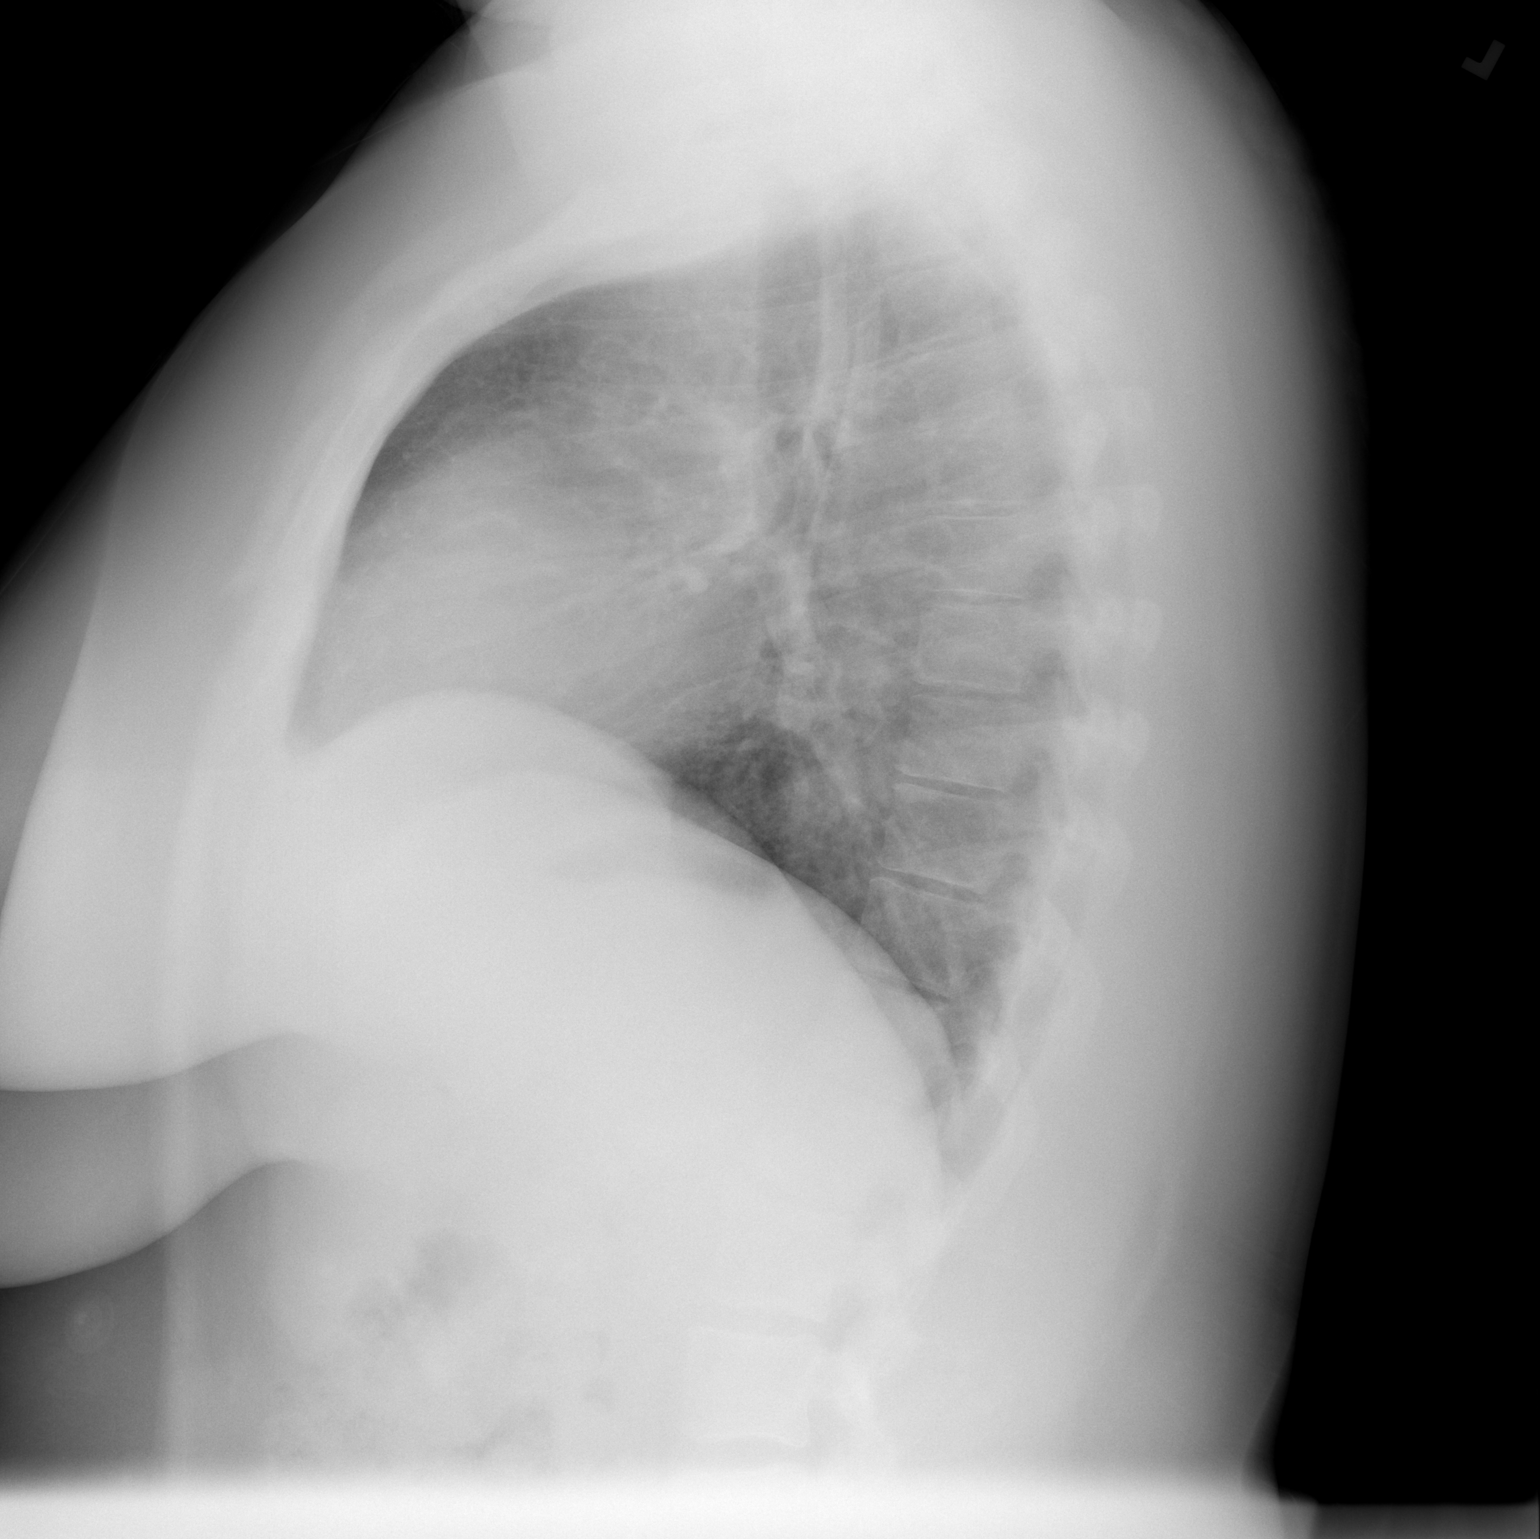

[2 of 2 positions shown; findings below may reference images not displayed]

FINDINGS: The heart size and mediastinal contours are within normal limits.
Both lungs are clear. The visualized skeletal structures are
unremarkable.
IMPRESSION: No active cardiopulmonary disease.
